# Patient Record
Sex: Male | Born: 1955
Health system: Southern US, Community
[De-identification: ages and names within clinical notes are randomized; demographics above are authoritative.]

## PROBLEM LIST (undated history)

## (undated) DIAGNOSIS — I10 Essential (primary) hypertension: Secondary | ICD-10-CM

## (undated) DIAGNOSIS — I700 Atherosclerosis of aorta: Secondary | ICD-10-CM

## (undated) DIAGNOSIS — Z973 Presence of spectacles and contact lenses: Secondary | ICD-10-CM

## (undated) DIAGNOSIS — E782 Mixed hyperlipidemia: Secondary | ICD-10-CM

## (undated) DIAGNOSIS — I2510 Atherosclerotic heart disease of native coronary artery without angina pectoris: Secondary | ICD-10-CM

## (undated) DIAGNOSIS — J449 Chronic obstructive pulmonary disease, unspecified: Secondary | ICD-10-CM

## (undated) DIAGNOSIS — H9193 Unspecified hearing loss, bilateral: Secondary | ICD-10-CM

## (undated) DIAGNOSIS — N400 Enlarged prostate without lower urinary tract symptoms: Secondary | ICD-10-CM

## (undated) DIAGNOSIS — F419 Anxiety disorder, unspecified: Secondary | ICD-10-CM

## (undated) DIAGNOSIS — J189 Pneumonia, unspecified organism: Secondary | ICD-10-CM

## (undated) DIAGNOSIS — M109 Gout, unspecified: Secondary | ICD-10-CM

## (undated) DIAGNOSIS — E785 Hyperlipidemia, unspecified: Secondary | ICD-10-CM

## (undated) DIAGNOSIS — I493 Ventricular premature depolarization: Secondary | ICD-10-CM

## (undated) HISTORY — DX: Ventricular premature depolarization: I49.3

## (undated) HISTORY — PX: ABDOMINAL SURGERY: SHX537

## (undated) HISTORY — PX: TONSILLECTOMY: SUR1361

## (undated) HISTORY — DX: Essential (primary) hypertension: I10

## (undated) HISTORY — DX: Hyperlipidemia, unspecified: E78.5

## (undated) HISTORY — PX: SHOULDER SURGERY: SHX246

## (undated) HISTORY — PX: EXPLORATORY LAPAROTOMY: SUR591

---

## 2001-04-17 ENCOUNTER — Emergency Department (HOSPITAL_COMMUNITY): Admission: EM | Admit: 2001-04-17 | Discharge: 2001-04-17 | Payer: Self-pay | Admitting: Internal Medicine

## 2001-04-24 ENCOUNTER — Emergency Department (HOSPITAL_COMMUNITY): Admission: EM | Admit: 2001-04-24 | Discharge: 2001-04-25 | Payer: Self-pay | Admitting: Emergency Medicine

## 2002-04-17 ENCOUNTER — Encounter: Payer: Self-pay | Admitting: Family Medicine

## 2002-04-17 ENCOUNTER — Observation Stay (HOSPITAL_COMMUNITY): Admission: EM | Admit: 2002-04-17 | Discharge: 2002-04-18 | Payer: Self-pay | Admitting: *Deleted

## 2002-04-18 ENCOUNTER — Encounter: Payer: Self-pay | Admitting: Internal Medicine

## 2002-04-20 ENCOUNTER — Encounter: Payer: Self-pay | Admitting: Family Medicine

## 2002-04-24 ENCOUNTER — Ambulatory Visit (HOSPITAL_COMMUNITY): Admission: RE | Admit: 2002-04-24 | Discharge: 2002-04-24 | Payer: Self-pay | Admitting: Family Medicine

## 2006-03-20 ENCOUNTER — Ambulatory Visit (HOSPITAL_COMMUNITY): Admission: RE | Admit: 2006-03-20 | Discharge: 2006-03-20 | Payer: Self-pay | Admitting: Urology

## 2008-02-25 ENCOUNTER — Encounter: Admission: RE | Admit: 2008-02-25 | Discharge: 2008-02-25 | Payer: Self-pay | Admitting: Otolaryngology

## 2009-05-10 ENCOUNTER — Ambulatory Visit (HOSPITAL_COMMUNITY): Admission: RE | Admit: 2009-05-10 | Discharge: 2009-05-11 | Payer: Self-pay | Admitting: Orthopedic Surgery

## 2009-05-10 HISTORY — PX: SHOULDER ARTHROSCOPY WITH SUBACROMIAL DECOMPRESSION AND BICEP TENDON REPAIR: SHX5689

## 2011-04-03 LAB — TYPE AND SCREEN: ABO/RH(D): O POS

## 2011-04-03 LAB — CBC
Hemoglobin: 15.6 g/dL (ref 13.0–17.0)
MCHC: 35 g/dL (ref 30.0–36.0)
RBC: 4.5 MIL/uL (ref 4.22–5.81)
WBC: 7.5 10*3/uL (ref 4.0–10.5)

## 2011-04-03 LAB — BASIC METABOLIC PANEL
CO2: 26 mEq/L (ref 19–32)
Calcium: 9.9 mg/dL (ref 8.4–10.5)
Creatinine, Ser: 0.97 mg/dL (ref 0.4–1.5)
GFR calc Af Amer: >60 mL/min (ref >60)
GFR calc non Af Amer: >60 mL/min (ref >60)
Sodium: 140 mEq/L (ref 135–145)

## 2011-04-03 LAB — DIFFERENTIAL
Basophils Relative: 1 % (ref 0–1)
Lymphocytes Relative: 26 % (ref 12–46)
Monocytes Relative: 7 % (ref 3–12)
Neutro Abs: 4.8 10*3/uL (ref 1.7–7.7)
Neutrophils Relative %: 63 % (ref 43–77)

## 2011-04-03 LAB — APTT: aPTT: 31 seconds (ref 24–37)

## 2011-04-03 LAB — PROTIME-INR: INR: 0.9 (ref 0.00–1.49)

## 2011-05-08 NOTE — Op Note (Signed)
NAME:  Anthony Summers, Anthony Summers NO.:  [id number]   MEDICAL RECORD NO.:  [id number]          PATIENT TYPE:  OIB   LOCATION:  5525                         FACILITY:  [facility]   PHYSICIAN:  Burnard Bunting, M.D.    DATE OF BIRTH:  12/03/1956   DATE OF PROCEDURE:  05/10/2009  DATE OF DISCHARGE:                               OPERATIVE REPORT   PREOPERATIVE DIAGNOSES:  1. Right shoulder bursitis.  2. Biceps tendon subluxation.  3. Rotator cuff tear.   POSTOPERATIVE DIAGNOSES:  1. Right shoulder bursitis.  2. Medial subluxation of the biceps tendon with intra-articular      synovitis.  3. Subacromial bursitis and complete tear without retraction of the      subscapularis tendon.   PROCEDURE:  Right shoulder diagnostic and operative arthroscopy with  biceps tendon release, extensive debridement of synovitis and labral  degeneration with release of the biceps tendon, subacromial  decompression, open biceps tenodesis, and open repair of the  subscapularis tendon tear.   SURGEON:  Burnard Bunting, MD   ASSISTANT:  April, RNFA   ANESTHESIA:  General.   INDICATIONS:  Anthony Summers is a 55 year old patient with right  shoulder pain, rotator cuff tearing, and  medial subluxation of the  biceps by MRI scanning.  He presents now for operative management after  explanation of risks and benefits.   OPERATIVE FINDINGS:  1. Examination under anesthesia, range of motion, forward flexion 180,      external rotation 50,  abduction is 65, shoulder stability is less      than a centimeter sulcus sign with less than 1+ anterior and      posterior instability.  2. Diagnostic operative arthroscopy.  3. Medial subluxation of the biceps.  4. Intact glenohumeral articular surface.  5. No injury to the anterior-inferior and posterior-inferior      glenohumeral ligaments.  6. Significant synovitis within the rotator interval with partial      tearing of the supraspinatus.  7. Complete tear of  the subscapularis, both the intra-articular      portion and the distal portion without retraction.   PROCEDURE IN DETAIL:  The patient brought to operating room where a  general endotracheal anesthesia was used.  Preoperative antibiotics were  administered.  Right shoulder, elbow, and hand was prescrubbed with  chlorhexidine, alcohol and allowed to air dry along with Betadine, then  prepped with DuraPrep solution and draped in a sterile manner.  Collier Flowers  was used to cover the axilla for the arthroscopy portion of the case.  Time-out was called.  Posterior portal was created 2 cm medial,  __________margin of the acromion.  Diagnostic arthroscopy was performed.  Significant synovitis was present within the glenohumeral joint.  Articular surfaces were intact.  Extensive debridement of synovitis was  performed along with release of biceps tendon and debridement of the  labrum as well as debridement of the partial-thickness rotator cuff  tear.  The intra-articular subscapularis was torn.  This tissue was  again debrided with the shaver through an anterior portal.  Lateral  portal was then created after scope was  placed in the subacromial space.  Significant bursitis was present.  Bursectomy was performed.  No full-  thickness component of supraspinatus tear was identified.  Subacromial  decompression with release of CA ligament was performed.  At this time,  the portals were closed.  The shoulder was reprepped and the entire  operative field was covered with Ioban.  Incision was made off the  anterolateral margin of the acromion.  Skin and subcutaneous tissue were  sharply divided.  Deltoid raphe was split between the anterior and  middle deltoid for measured distance of 4 cm.  A 0-Vicryl stay suture  was placed.  The subacromial decompression was adequate.  The transverse  humeral ligament was then divided on the medial side, biceps tendon was  actually subluxated medially across the entire  lesser tuberosity.  This  indicated that the subscapularis was completely detached from the lesser  tuberosity.  The biceps was then sutured with #2 FiberLoop and tenodesed  using 7 x 22 mm bioabsorbable interference screw into the humeral head.  The subscapularis tendon was then repaired back to the lesser tuberosity  after scraping of the lesser tuberosity.  The 15055 corkscrew suture  anchor was placed.  There was a small crack, although the suture anchor  was completely stable.  There was small distal crack in the tuberosity,  which necessitated placement of a single curved tech tunnel.  Through  this curved tech tunnel, two #2 FiberWire sutures were placed.  The  subscapularis was then reattached back to the lesser tuberosity using  the four strands of the anchor suture and the two strands through the  tendon.  All in all six strands were used to hold the subscapularis in  position.  The arm was taken through a range of motion.  The repair was  found to be stable.  The tissue lateral to the repair was then oversewn  to the tissue through the transverse humeral ligament and rotator cuff  tissue on the lateral aspect of the other bicipital groove and this gave  a reinforced repair which was done with 2-0 FiberWire sutures.  Incision  was thoroughly irrigated.  Instruments were removed.  Deltoid split was  closed using 0-Vicryl suture followed by interrupted inverted 2-0 Vicryl  suture, running 3-0 pullout Prolene, solution of Marcaine, morphine  finally injected in the subacromial space.  The patient tolerated well  without immediate complication.  Bulky dressing and shoulder mobilizer  was placed.   April's assistance was required at all times for retraction of important  neurovascular structures.  Assistance was a medical necessity.       Burnard Bunting, M.D.  Electronically Signed     GSD/MEDQ  D:  05/10/2009  T:  05/11/2009  Job:  [id number]

## 2011-05-11 NOTE — Consult Note (Signed)
[place]  Patient:    Anthony Summers, Anthony Summers                    MRN: [id number] Proc. Date: 04/25/01 Adm. Date:  [id number] Disc. Date: [id number] Attending:  Cathren Laine                          Consultation Report  REASON FOR CONSULTATION:  Epistaxis.  HISTORY OF PRESENT ILLNESS:  This is a 55 year old gentleman who was evaluated earlier today by Lucky Cowboy, M.D. in our office for recurring right-sided epistaxis. She was not able to identify a bleeding site. There was severe deviation of the septum and large turbinates on the right side limiting exposure on the right side. He had been bleeding intermittently throughout the day, but it got real severe around 11 oclock at night. The patient has a history of alcohol abuse and cigarette smoking. No other pertinent medical history.  PHYSICAL EXAMINATION:  GENERAL:  Healthy-appearing gentleman in no distress. He does not appear to be particularly pale. He is alert and oriented x 3.  VITAL SIGNS:  Stable.  HEENT:  Examination of the nasal cavities reveal a large spur down the right inferior nasal cavity and another spur posteriorly on the right side. The inferior turbinate is thickened and displaced medially.  PROCEDURE:  The nasal cavity was packed with cotton, soaked in 4% cocaine solution for topical anesthesia; 1% Xylocaine with epinephrine was then infiltrated into the septum, the inferior turbinate, the middle turbinate, and the superior turbinate. A greater palatine block was also accomplished.  Nasal endoscopy was performed with a flexible nasopharyngoscope. After a thorough evaluation, I was not able to identify any abnormal tissue or granulation tissue that might explain the source of the bleeding. We tried provocative maneuvers. I had him standing up, bending over repeatedly, and we were not able to start any further bleeding. Decision was made to pack the nose. Six feet Vaseline gauze  was then used to pack the superior and posterior nasal vault area. The inferior aspect of the nasal cavity was packed with a Rhino Rocket Merocel pack which was inflated with anesthetic solution. The patient was observed in the ER for about 30 minutes. There was no further bleeding. He was then discharged to home. Prescription was given for Keflex 500 mg q.i.d., and he is instructed to follow up in 5 days in the office for packing removal or sooner if he has any additional heavy bleeding. DD:  04/25/01 TD:  04/26/01 Job: [id number]

## 2011-05-11 NOTE — H&P (Signed)
[place]  Patient:    Anthony Summers, Anthony Summers Visit Number: [id number] MRN: [id number]          Service Type: MED Location: [phone] Attending Physician:  Jerl Santos Dictated by:   Jerl Santos, M.D. Admit Date:  04/17/2002 Discharge Date: 04/18/2002   CC:         Teena Irani. Arlyce Dice, M.D.  [facility]   History and Physical  HISTORY OF PRESENT ILLNESS:  Anthony Summers is a very pleasant 55 year old man who indicates that today he returned from [place], [place], after seeing his new grandchild.  On the trip back, he began to experience numbness in his right arm.  He initially attributed this to how he was holding his arm on the steering wheel, but the numbness did not seem to resolve with movement. Shortly thereafter, he began to experience numbness in his right leg.  This was quite noticeable and eventually he decided to get out of the car and walk around.  This did not seem to help very much.  At this point, he noticed that he did not have any headache.  There was no visual blurring and no ataxia. There was no weakness.  On arrival home, he began to experience nausea and this together with the numbness led him to decide to come to the emergency room.  Nothing like this had ever happened before.  He has no history of migraine headaches.  The past medical history is remarkable for past documentation of hyperlipidemia with a cholesterol of 260 and for a 50-pack-year smoking history.  He continues to smoke two packs of cigarettes per day.  There is no family history of heart disease or stroke.  There is no family history of hypertension or diabetes.  In the emergency room, a CT scan of the brain was performed which was negative.  The patient had a series of laboratory studies performed which were within normal limits.  He is admitted at this time for possible TIA versus CVA.  PAST MEDICAL HISTORY:  Strikingly normal.  No  medical illnesses.  PAST SURGICAL HISTORY:  None.  MEDICATIONS:  The patient takes no medicines.  ALLERGIES:  He has no known drug allergies.  HABITS:  He smokes two pack of cigarettes per day.  He denies abuse of alcohol or drugs.  FAMILY HISTORY:  Both his mother and father are in good health.  He has two half-brothers who are also in good health.  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  REVIEW OF SYSTEMS:  Head:  He denies headaches or dizziness.  Eyes:  He denies visual blurring or diplopia.  Ears, Nose, and Throat:  Denies earaches, sinus pain, or sore throat.  Chest:  Denies cough, wheezing, or chest congestion. Cardiovascular:  Denies orthopnea, PND, or ankle edema.  GI:  Denies nausea, vomiting, or abdominal pain.  GU:  Denies dysuria, urinary frequency, hesitancy, or nocturia.  Rheumatologic:  Denies back pain or joint pain. Hematologic:  Denies easy bleeding or bruising.  Neurologic:  Denies a history of seizure or stroke.  Otherwise see above.  PHYSICAL EXAMINATION:  The HEENT exam is within normal limits.  CHEST:  Clear.  CARDIOVASCULAR:  Normal S1 and S2 without murmurs, rubs, or gallops.  ABDOMEN:  Benign.  Normoactive bowel sounds without masses, tenderness, or organomegaly.  NEUROLOGIC:  Cranial nerves, motor, sensory, and cerebellar testing are normal.  Station and  gait are normal.  EXTREMITIES:  Normal.  IMPRESSION: 1. Possible transient ischemic attack versus cerebrovascular accident. 2. A 50-pack-year smoking history. 3. History of increased cholesterol.  PLAN:  The patient will be admitted for evaluation.  A neurologic consultation may be warranted.  I will schedule an MRI scan, carotid Doppler, 2-D echocardiogram, lipid profile, and homocystine level.  A neurology consultation may be warranted. Dictated by:   Jerl Santos, M.D. Attending Physician:  Jerl Santos DD:  04/17/02 TD:   04/19/02 Job: [id number]

## 2014-06-20 ENCOUNTER — Encounter (HOSPITAL_COMMUNITY): Payer: Self-pay | Admitting: Emergency Medicine

## 2014-06-20 ENCOUNTER — Emergency Department (HOSPITAL_COMMUNITY)
Admission: EM | Admit: 2014-06-20 | Discharge: 2014-06-20 | Disposition: A | Discharge status: Home / Self Care | Payer: 59 | Source: Home / Self Care | Attending: Emergency Medicine | Admitting: Emergency Medicine

## 2014-06-20 ENCOUNTER — Emergency Department (INDEPENDENT_AMBULATORY_CARE_PROVIDER_SITE_OTHER): Payer: 59

## 2014-06-20 DIAGNOSIS — Y92009 Unspecified place in unspecified non-institutional (private) residence as the place of occurrence of the external cause: Secondary | ICD-10-CM

## 2014-06-20 DIAGNOSIS — R0789 Other chest pain: Secondary | ICD-10-CM

## 2014-06-20 DIAGNOSIS — W19XXXA Unspecified fall, initial encounter: Secondary | ICD-10-CM

## 2014-06-20 MED ORDER — OXYCODONE-ACETAMINOPHEN 5-325 MG PO TABS: 1.0000 | ORAL_TABLET | ORAL | Status: DC | PRN

## 2014-06-20 MED ORDER — HYDROCODONE-ACETAMINOPHEN 5-325 MG PO TABS
1.0000 | ORAL_TABLET | Freq: Once | ORAL | Status: AC
  Administered 2014-06-20: 1 via ORAL

## 2014-06-20 MED ORDER — IBUPROFEN 800 MG PO TABS
ORAL_TABLET | ORAL | Status: AC
  Filled 2014-06-20: qty 1

## 2014-06-20 MED ORDER — IBUPROFEN 800 MG PO TABS
800.0000 mg | ORAL_TABLET | Freq: Once | ORAL | Status: AC
  Administered 2014-06-20: 800 mg via ORAL

## 2014-06-20 MED ORDER — HYDROCODONE-ACETAMINOPHEN 5-325 MG PO TABS
ORAL_TABLET | ORAL | Status: AC
  Filled 2014-06-20: qty 1

## 2014-06-20 NOTE — ED Notes (Signed)
Reports falling down front porch steps yesterday.  Pt having pain left rib cage.  Pain with coughing.  Denies sob.   Mild relief with aleve.

## 2014-06-20 NOTE — Discharge Instructions (Signed)
Please review instructions below. Use the incentive spirometry as instructed every 1 to 2 hours while awake and at home. Take Ibuprofen 600-800 mg three times a day for the next 4-6 days then only as needed afterwards. Use the Percocet as directed for more severe pain. Arrange follow up with Dr Alyson Ingles if not gradually improving over the next week.   Chest Wall Pain Chest wall pain is pain felt in or around the chest bones and muscles. It may take up to 6 weeks to get better. It may take longer if you are active. Chest wall pain can happen on its own. Other times, things like germs, injury, coughing, or exercise can cause the pain. HOME CARE   Avoid activities that make you tired or cause pain. Try not to use your chest, belly (abdominal), or side muscles. Do not use heavy weights.  Put ice on the sore area.  Put ice in a plastic bag.  Place a towel between your skin and the bag.  Leave the ice on for 15-20 minutes for the first 2 days.  Only take medicine as told by your doctor. GET HELP RIGHT AWAY IF:   You have more pain or are very uncomfortable.  You have a fever.  Your chest pain gets worse.  You have new problems.  You feel sick to your stomach (nauseous) or throw up (vomit).  You start to sweat or feel lightheaded.  You have a cough with mucus (phlegm).  You cough up blood. MAKE SURE YOU:   Understand these instructions.  Will watch your condition.  Will get help right away if you are not doing well or get worse. Document Released: 05/28/2008 Document Revised: 03/03/2012 Document Reviewed: 08/06/2011 [facility] Patient Information 2015 [place], [place]. This information is not intended to replace advice given to you by your health care provider. Make sure you discuss any questions you have with your health care provider.

## 2014-06-29 NOTE — ED Provider Notes (Signed)
CSN: [id number]     Arrival date & time 06/20/14  2426 History   First MD Initiated Contact with Patient 06/20/14 1044     Chief Complaint  Patient presents with  . Fall    rib injury    Patient is a 58 y.o. male presenting with fall. The history is provided by the patient.  Fall This is a new problem. The current episode started yesterday. The problem occurs constantly. The problem has been gradually worsening. Associated symptoms include chest pain. Pertinent negatives include no abdominal pain, no headaches and no shortness of breath. The symptoms are aggravated by coughing. The symptoms are relieved by NSAIDs. The treatment provided mild relief.  Pt reports he tripped and fell on some concrete steps yesterday landing on his (L) side. Since the fall he has had sharp (L) sided rib area pain that is worse w/ deep inspiration and coughing. Denies other injuries.   History reviewed. No pertinent past medical history. Past Surgical History  Procedure Laterality Date  . Shoulder surgery     History reviewed. No pertinent family history. History  Substance Use Topics  . Smoking status: Never Smoker   . Smokeless tobacco: Not on file  . Alcohol Use: Yes    Review of Systems  Respiratory: Negative for shortness of breath.   Cardiovascular: Positive for chest pain.  Gastrointestinal: Negative for abdominal pain.  Neurological: Negative for headaches.  All other systems reviewed and are negative.   Allergies  Review of patient's allergies indicates no known allergies.  Home Medications   Prior to Admission medications   Medication Sig Start Date End Date Taking? Authorizing Provider  cholecalciferol (VITAMIN D) 1000 UNITS tablet Take 1,000 Units by mouth daily.   Yes Historical Provider, MD  Misc Natural Products (NF FORMULAS TESTOSTERONE PO) Take by mouth.   Yes Historical Provider, MD  oxyCODONE-acetaminophen (PERCOCET/ROXICET) 5-325 MG per tablet Take 1 tablet by mouth every 4  (four) hours as needed for severe pain. 06/20/14   Rhetta Mura Schorr, NP   BP 165/84  Pulse 83  Temp(Src) 98.3 F (36.8 C) (Oral)  Resp 18  SpO2 98% Physical Exam  Nursing note and vitals reviewed. Constitutional: He is oriented to person, place, and time. He appears well-developed and well-nourished.  HENT:  Head: Normocephalic and atraumatic.  Eyes: Conjunctivae are normal.  Neck: Spinous process tenderness present.  Cardiovascular: Normal rate.   Pulmonary/Chest: Effort normal. He has wheezes.  No objective findings c/w trauma. Expiratory wheezes bil L>R.   Musculoskeletal:  No bony spinal TTP  Neurological: He is alert and oriented to person, place, and time.  Skin: Skin is warm and dry.  Psychiatric: He has a normal mood and affect.    ED Course  Procedures (including critical care time) Labs Review Labs Reviewed - No data to display  Imaging Review No results found.   MDM   1. Chest pain, musculoskeletal   2. Fall at home, initial encounter    (L) lateral chest wall pain s/p fall on concrete steps. CXR negative for rib fx's but will treat clinically for same. Pt to use incentive spirometry q1-2h while awake. Oxycodone/APAP for pain. Pt to arrange f/u w/ PCP.    [place], NP 06/29/14 [phone]

## 2014-06-29 NOTE — ED Provider Notes (Signed)
Medical screening examination/treatment/procedure(s) were performed by non-physician practitioner and as supervising physician I was immediately available for consultation/collaboration.  Philipp Deputy, M.D.  Harden Mo, MD 06/29/14 2024

## 2014-10-08 ENCOUNTER — Other Ambulatory Visit: Payer: Self-pay

## 2017-01-17 ENCOUNTER — Ambulatory Visit (INDEPENDENT_AMBULATORY_CARE_PROVIDER_SITE_OTHER): Payer: 59

## 2017-01-17 ENCOUNTER — Ambulatory Visit (INDEPENDENT_AMBULATORY_CARE_PROVIDER_SITE_OTHER): Payer: 59 | Admitting: Orthopedic Surgery

## 2017-01-17 ENCOUNTER — Encounter (INDEPENDENT_AMBULATORY_CARE_PROVIDER_SITE_OTHER): Payer: Self-pay | Admitting: Orthopedic Surgery

## 2017-01-17 DIAGNOSIS — M25511 Pain in right shoulder: Secondary | ICD-10-CM

## 2017-01-17 DIAGNOSIS — G8929 Other chronic pain: Secondary | ICD-10-CM

## 2017-01-18 ENCOUNTER — Encounter (INDEPENDENT_AMBULATORY_CARE_PROVIDER_SITE_OTHER): Payer: Self-pay | Admitting: Orthopedic Surgery

## 2017-01-18 NOTE — Progress Notes (Signed)
Office Visit Note   Patient: Anthony Summers           Date of Birth: 1956-05-28           MRN: [id number] Visit Date: 01/17/2017 Requested by: No referring provider defined for this encounter. PCP: No primary care provider on file.  Subjective: Chief Complaint  Patient presents with  . Right Shoulder - Pain    HPI [name] is a 61 year old patient with right shoulder pain.  Been going on 6-7 weeks.  He was unloading a bulky but not heavy item 7 weeks ago when he felt something hurt in his shoulder down to his elbow.  Describes decent range of motion but has pain at rest.  Does have a history of surgery 8 years ago with biceps tendon release and rotator cuff repair with the subscapularis.  He's been playing golf since this injury but it does hurt him some.  He tried Aleve.  He is able to sleep reasonably well.              Review of Systems All systems reviewed are negative as they relate to the chief complaint within the history of present illness.  Patient denies  fevers or chills.    Assessment & Plan: Visit Diagnoses:  1. Chronic right shoulder pain     Plan: Impression right shoulder pain possible biceps tendon tenodesis late failure.  I did look at the shoulder with ultrasound and the subscap appears intact.  Supraspinatus looks a little thin but I can't say that I saw definite tear.  Anthony Summers see how he does for 3 weeks taking anti-inflammatory on a daily basis.  He'll call me in 3 weeks and we could consider setting up an MRI scan at that time.  Follow-Up Instructions: No Follow-up on file.   Orders:  Orders Placed This Encounter  Procedures  . XR Shoulder Right   No orders of the defined types were placed in this encounter.     Procedures: No procedures performed   Clinical Data: No additional findings.  Objective: Vital Signs: There were no vitals taken for this visit.  Physical Exam   Constitutional: Patient appears well-developed HEENT:  Head:  Normocephalic Eyes:EOM are normal Neck: Normal range of motion Cardiovascular: Normal rate Pulmonary/chest: Effort normal Neurologic: Patient is alert Skin: Skin is warm Psychiatric: Patient has normal mood and affect    Ortho Exam examination of the right shoulder demonstrates full active and passive range of motion.  He doesn't have a definite Popeye deformity but there is asymmetry in a difference right versus left and the biceps tendon.  His supraspinatus subscap and infraspinatus strength is intact.  Impingement sign is negative.  No real before meals joint tenderness to direct palpation.  No other masses lymph and IV or skin changes noted in the shoulder region  Specialty Comments:  No specialty comments available.  Imaging: Xr Shoulder Right  Result Date: 01/18/2017 AP outlet and axillary right shoulder reviewed.  Acromiohumeral distance is maintained.  Visualized lung fields clear.  No glenohumeral arthritis is present.    PMFS History: There are no active problems to display for this patient.  No past medical history on file.  No family history on file.  Past Surgical History:  Procedure Laterality Date  . SHOULDER SURGERY     Social History   Occupational History  . Not on file.   Social History Main Topics  . Smoking status: Current Every Day Smoker  . Smokeless  tobacco: Never Used  . Alcohol use Yes  . Drug use: No  . Sexual activity: Yes

## 2017-01-25 ENCOUNTER — Emergency Department (HOSPITAL_COMMUNITY)
Admission: EM | Admit: 2017-01-25 | Discharge: 2017-01-25 | Discharge status: Against Medical Advice | Payer: 59 | Attending: Emergency Medicine | Admitting: Emergency Medicine

## 2017-01-25 ENCOUNTER — Encounter (HOSPITAL_COMMUNITY): Payer: Self-pay

## 2017-01-25 DIAGNOSIS — L509 Urticaria, unspecified: Secondary | ICD-10-CM | POA: Diagnosis not present

## 2017-01-25 DIAGNOSIS — T7840XA Allergy, unspecified, initial encounter: Secondary | ICD-10-CM

## 2017-01-25 DIAGNOSIS — F17200 Nicotine dependence, unspecified, uncomplicated: Secondary | ICD-10-CM | POA: Insufficient documentation

## 2017-01-25 DIAGNOSIS — Z79899 Other long term (current) drug therapy: Secondary | ICD-10-CM | POA: Diagnosis not present

## 2017-01-25 DIAGNOSIS — L500 Allergic urticaria: Secondary | ICD-10-CM | POA: Diagnosis not present

## 2017-01-25 MED ORDER — PREDNISONE 50 MG PO TABS
60.0000 mg | ORAL_TABLET | Freq: Once | ORAL | Status: AC
  Administered 2017-01-25: 60 mg via ORAL
  Filled 2017-01-25: qty 1

## 2017-01-25 MED ORDER — FAMOTIDINE 20 MG PO TABS
20.0000 mg | ORAL_TABLET | Freq: Once | ORAL | Status: AC
  Administered 2017-01-25: 20 mg via ORAL
  Filled 2017-01-25: qty 1

## 2017-01-25 NOTE — ED Provider Notes (Signed)
[facility] DEPT Provider Note   CSN: [id number] Arrival date & time: 01/25/17  0139  Time seen 01:55 AM   History   Chief Complaint Chief Complaint  Patient presents with  . Allergic Reaction    HPI Anthony Summers is a 61 y.o. male.  HPI   patient reports about 2 hours ago he was watching TV and he started itching and breaking out in hives. He denies any swelling of his lips or tongue, any difficulty breathing or swallowing. He denies being on any blood pressure medications. He denies any new exposures today although they did go into a music shop today that he suspects has a lot of chemicals although they couldn't smell them. He also states he's been taking one Aleve twice a day for some discomfort in his shoulder. Patient states he took Benadryl about 1245. He states his itching is improved and the rash is improving.  PCP Thressa Sheller, MD  History reviewed. No pertinent past medical history.  There are no active problems to display for this patient.   Past Surgical History:  Procedure Laterality Date  . SHOULDER SURGERY         Home Medications    Prior to Admission medications   Medication Sig Start Date End Date Taking? Authorizing Provider  cholecalciferol (VITAMIN D) 1000 UNITS tablet Take 1,000 Units by mouth daily.   Yes Historical Provider, MD  naproxen sodium (ANAPROX) 220 MG tablet Take 220 mg by mouth 2 (two) times daily with a meal.   Yes Historical Provider, MD  buPROPion (ZYBAN) 150 MG 12 hr tablet Take 150 mg by mouth 2 (two) times daily.    Historical Provider, MD  Misc Natural Products (NF FORMULAS TESTOSTERONE PO) Take by mouth.    Historical Provider, MD  oxyCODONE-acetaminophen (PERCOCET/ROXICET) 5-325 MG per tablet Take 1 tablet by mouth every 4 (four) hours as needed for severe pain. Patient not taking: Reported on 01/17/2017 06/20/14   Jeryl Columbia, NP    Family History No family history on file.  Social History Social History    Substance Use Topics  . Smoking status: Current Every Day Smoker  . Smokeless tobacco: Never Used  . Alcohol use Yes  retired Smokes 1 ppd   Allergies   Patient has no known allergies.   Review of Systems Review of Systems  All other systems reviewed and are negative.    Physical Exam Updated Vital Signs BP 127/85 (BP Location: Left Arm)   Pulse 103   Temp 97.5 F (36.4 C) (Oral)   Resp 16   Ht 6' (1.829 m)   Wt 215 lb (97.5 kg)   SpO2 94%   BMI 29.16 kg/m   Vital signs normal except for tachycardia   Physical Exam  Constitutional: He is oriented to person, place, and time. He appears well-developed and well-nourished.  Non-toxic appearance. He does not appear ill. No distress.  HENT:  Head: Normocephalic and atraumatic.  Right Ear: External ear normal.  Left Ear: External ear normal.  Nose: Nose normal. No mucosal edema or rhinorrhea.  Mouth/Throat: Oropharynx is clear and moist and mucous membranes are normal. No dental abscesses or uvula swelling.  Eyes: Conjunctivae and EOM are normal. Pupils are equal, round, and reactive to light.  Neck: Normal range of motion and full passive range of motion without pain. Neck supple.  Cardiovascular: Normal rate, regular rhythm and normal heart sounds.  Exam reveals no gallop and no friction rub.   No murmur heard.  Pulmonary/Chest: Effort normal and breath sounds normal. No respiratory distress. He has no wheezes. He has no rhonchi. He has no rales. He exhibits no tenderness and no crepitus.  Abdominal: Soft. Normal appearance and bowel sounds are normal. He exhibits no distension. There is no tenderness. There is no rebound and no guarding.  Musculoskeletal: Normal range of motion. He exhibits no edema or tenderness.  Moves all extremities well.   Neurological: He is alert and oriented to person, place, and time. He has normal strength. No cranial nerve deficit.  Skin: Skin is warm, dry and intact. Rash noted. No  erythema. No pallor.  Patient now just has a few scattered small urticaria on his lower abdomen.  Psychiatric: He has a normal mood and affect. His speech is normal and behavior is normal. His mood appears not anxious.  Nursing note and vitals reviewed.    ED Treatments / Results    Procedures Procedures (including critical care time)  Medications Ordered in ED Medications  predniSONE (DELTASONE) tablet 60 mg (60 mg Oral Given 01/25/17 0220)  famotidine (PEPCID) tablet 20 mg (20 mg Oral Given 01/25/17 0221)     Initial Impression / Assessment and Plan / ED Course  I have reviewed the triage vital signs and the nursing notes.  Pertinent labs & imaging results that were available during my care of the patient were reviewed by me and considered in my medical decision making (see chart for details).   Pt was given oral prednisone and oral pepcid (he had refused an IV when nurse saw him).  Nurse reports after he got his medications he wanted to leave and left. He did not wait to see if the medication worked or to get discharge prescriptions.   Final Clinical Impressions(s) / ED Diagnoses   Final diagnoses:  Allergic reaction, initial encounter  Urticaria    Pt eloped without getting discharge instructions or medications.   Rolland Porter, MD, Barbette Or, MD 01/25/17 [phone]

## 2017-01-25 NOTE — ED Triage Notes (Signed)
Pt states he was getting ready for bed when he noticed he was red and had whelts all over his torso, mild itching.  Pt denies knowing what is causing this reaction, states no new products or medications.   Pt denies throat or tongue swelling

## 2017-01-25 NOTE — ED Notes (Signed)
ED Provider at bedside. 

## 2017-01-25 NOTE — ED Notes (Signed)
Per pt's wife he was springing himself and he was leaving. Pt walked out with no distress noted.

## 2017-02-21 DIAGNOSIS — E559 Vitamin D deficiency, unspecified: Secondary | ICD-10-CM | POA: Diagnosis not present

## 2017-02-21 DIAGNOSIS — E291 Testicular hypofunction: Secondary | ICD-10-CM | POA: Diagnosis not present

## 2017-02-21 DIAGNOSIS — E785 Hyperlipidemia, unspecified: Secondary | ICD-10-CM | POA: Diagnosis not present

## 2017-05-29 DIAGNOSIS — H9312 Tinnitus, left ear: Secondary | ICD-10-CM | POA: Diagnosis not present

## 2017-05-29 DIAGNOSIS — H903 Sensorineural hearing loss, bilateral: Secondary | ICD-10-CM | POA: Diagnosis not present

## 2017-06-05 DIAGNOSIS — H9312 Tinnitus, left ear: Secondary | ICD-10-CM | POA: Diagnosis not present

## 2017-06-05 DIAGNOSIS — H903 Sensorineural hearing loss, bilateral: Secondary | ICD-10-CM | POA: Diagnosis not present

## 2017-06-12 DIAGNOSIS — I10 Essential (primary) hypertension: Secondary | ICD-10-CM | POA: Diagnosis not present

## 2017-09-05 ENCOUNTER — Ambulatory Visit (INDEPENDENT_AMBULATORY_CARE_PROVIDER_SITE_OTHER): Payer: 59 | Admitting: Orthopedic Surgery

## 2017-09-20 DIAGNOSIS — T464X5A Adverse effect of angiotensin-converting-enzyme inhibitors, initial encounter: Secondary | ICD-10-CM | POA: Diagnosis not present

## 2017-09-20 DIAGNOSIS — R05 Cough: Secondary | ICD-10-CM | POA: Diagnosis not present

## 2017-09-20 DIAGNOSIS — I10 Essential (primary) hypertension: Secondary | ICD-10-CM | POA: Diagnosis not present

## 2017-11-13 DIAGNOSIS — J4521 Mild intermittent asthma with (acute) exacerbation: Secondary | ICD-10-CM | POA: Diagnosis not present

## 2018-01-08 DIAGNOSIS — L821 Other seborrheic keratosis: Secondary | ICD-10-CM | POA: Diagnosis not present

## 2018-01-08 DIAGNOSIS — D225 Melanocytic nevi of trunk: Secondary | ICD-10-CM | POA: Diagnosis not present

## 2018-01-08 DIAGNOSIS — L820 Inflamed seborrheic keratosis: Secondary | ICD-10-CM | POA: Diagnosis not present

## 2018-02-11 DIAGNOSIS — H40013 Open angle with borderline findings, low risk, bilateral: Secondary | ICD-10-CM | POA: Diagnosis not present

## 2018-02-11 DIAGNOSIS — H2513 Age-related nuclear cataract, bilateral: Secondary | ICD-10-CM | POA: Diagnosis not present

## 2018-02-11 DIAGNOSIS — H25013 Cortical age-related cataract, bilateral: Secondary | ICD-10-CM | POA: Diagnosis not present

## 2018-03-27 DIAGNOSIS — E291 Testicular hypofunction: Secondary | ICD-10-CM | POA: Diagnosis not present

## 2018-03-27 DIAGNOSIS — I10 Essential (primary) hypertension: Secondary | ICD-10-CM | POA: Diagnosis not present

## 2018-03-27 DIAGNOSIS — E785 Hyperlipidemia, unspecified: Secondary | ICD-10-CM | POA: Diagnosis not present

## 2018-04-02 DIAGNOSIS — Z0000 Encounter for general adult medical examination without abnormal findings: Secondary | ICD-10-CM | POA: Diagnosis not present

## 2018-09-11 DIAGNOSIS — Z23 Encounter for immunization: Secondary | ICD-10-CM | POA: Diagnosis not present

## 2018-09-23 DIAGNOSIS — T7840XA Allergy, unspecified, initial encounter: Secondary | ICD-10-CM | POA: Diagnosis not present

## 2018-09-23 DIAGNOSIS — J449 Chronic obstructive pulmonary disease, unspecified: Secondary | ICD-10-CM | POA: Diagnosis not present

## 2018-09-23 DIAGNOSIS — I10 Essential (primary) hypertension: Secondary | ICD-10-CM | POA: Diagnosis not present

## 2018-09-23 DIAGNOSIS — L509 Urticaria, unspecified: Secondary | ICD-10-CM | POA: Diagnosis not present

## 2018-09-24 DIAGNOSIS — J449 Chronic obstructive pulmonary disease, unspecified: Secondary | ICD-10-CM | POA: Diagnosis not present

## 2018-09-24 DIAGNOSIS — T7840XA Allergy, unspecified, initial encounter: Secondary | ICD-10-CM | POA: Diagnosis not present

## 2018-09-24 DIAGNOSIS — I10 Essential (primary) hypertension: Secondary | ICD-10-CM | POA: Diagnosis not present

## 2018-10-13 ENCOUNTER — Emergency Department (HOSPITAL_COMMUNITY)
Admission: EM | Admit: 2018-10-13 | Discharge: 2018-10-13 | Disposition: A | Discharge status: Home / Self Care | Payer: 59 | Attending: Emergency Medicine | Admitting: Emergency Medicine

## 2018-10-13 ENCOUNTER — Encounter (HOSPITAL_COMMUNITY): Payer: Self-pay | Admitting: Emergency Medicine

## 2018-10-13 DIAGNOSIS — F17210 Nicotine dependence, cigarettes, uncomplicated: Secondary | ICD-10-CM | POA: Diagnosis not present

## 2018-10-13 DIAGNOSIS — Z79899 Other long term (current) drug therapy: Secondary | ICD-10-CM | POA: Insufficient documentation

## 2018-10-13 DIAGNOSIS — L509 Urticaria, unspecified: Secondary | ICD-10-CM | POA: Insufficient documentation

## 2018-10-13 MED ORDER — FAMOTIDINE 20 MG PO TABS
40.0000 mg | ORAL_TABLET | Freq: Once | ORAL | Status: AC
Start: 2018-10-13 — End: 2018-10-13
  Administered 2018-10-13: 40 mg via ORAL
  Filled 2018-10-13: qty 2

## 2018-10-13 MED ORDER — PREDNISONE 10 MG PO TABS: 40.0000 mg | ORAL_TABLET | Freq: Every day | ORAL | 0 refills | Status: AC

## 2018-10-13 MED ORDER — PREDNISONE 50 MG PO TABS
60.0000 mg | ORAL_TABLET | Freq: Once | ORAL | Status: AC
  Administered 2018-10-13: 60 mg via ORAL
  Filled 2018-10-13: qty 1

## 2018-10-13 NOTE — Discharge Instructions (Signed)
Take 25mg  of benadryl every 6 hours as needed for itching. This medication can cause drowsiness. Do not drive or operate heavy machinery when taking this medication.   Prednisone - This is an oral steroid.  This medication is best taken with food in the morning.  Please note that this medication can cause anxiety, mood swings, muscle fatigue, increased hunger, weight gain (sodium/fluid retention), poor sleep as well as other symptoms. If you are a diabetic, please monitor your blood sugars at home as this medication can increase your blood sugars.   Call your pcp tomorrow to schedule an appointment for follow up.   Return for You have a fever. You have pain in your abdomen. You vomit Your tongue or lips are swollen. Your eyelids are swollen. Your chest or throat feels tight. You have trouble breathing or swallowing.

## 2018-10-13 NOTE — ED Triage Notes (Signed)
Pt states he has large itching whelps on his buttocks starting about an hour ago.  Taken 2 benadryl at 530 with no relief.  States this happened 3 weeks ago but was on upper body.

## 2018-10-13 NOTE — ED Provider Notes (Signed)
[facility] EMERGENCY DEPARTMENT Provider Note   CSN: [id number] Arrival date & time: 10/13/18  1847     History   Chief Complaint Chief Complaint  Patient presents with  . Allergic Reaction    HPI Anthony Summers is a 62 y.o. male with no significant past medical history presents emergency department today for hives.  Patient reports that 3 hours ago he started noticing hives on his buttocks.  His wife tried marking these areas but they have moved since onset.  He notes they are pruritic, mildly raised and red in characteristics.  He is tried Benadryl cream as well as 50 mg oral Benadryl for his symptoms to help with the itching.  He has a similar episode a few months ago in which his symptoms were relieved with prednisone and Benadryl.  He has never seen an allergist.  Patient denies any lip swelling, tongue swelling, difficulty swallowing, inability to control secretions, tightness of the throat, difficulty breathing, shortness of breath, chest tightness, abdominal cramping, nausea/vomiting/diarrhea.  Rash does not involve other areas of the skin or palms/soles. Denies fever, chills, contacts with persons with similar rash, or any changes in lotions/soaps/detergents, exposure to animal or plant irritants, and denies swelling or purulent discharge. No new medications. No recent travel. No recent tick bites. No involvement between webspaces.   HPI  History reviewed. No pertinent past medical history.  There are no active problems to display for this patient.   Past Surgical History:  Procedure Laterality Date  . ABDOMINAL SURGERY    . SHOULDER SURGERY          Home Medications    Prior to Admission medications   Medication Sig Start Date End Date Taking? Authorizing Provider  buPROPion (ZYBAN) 150 MG 12 hr tablet Take 150 mg by mouth 2 (two) times daily.    [provider]  cholecalciferol (VITAMIN D) 1000 UNITS tablet Take 1,000 Units by mouth daily.    [provider]  Misc Natural Products (NF FORMULAS TESTOSTERONE PO) Take by mouth.    [provider]  naproxen sodium (ANAPROX) 220 MG tablet Take 220 mg by mouth 2 (two) times daily with a meal.    [provider]  oxyCODONE-acetaminophen (PERCOCET/ROXICET) 5-325 MG per tablet Take 1 tablet by mouth every 4 (four) hours as needed for severe pain. Patient not taking: Reported on 01/17/2017 06/20/14   Schorr, Rhetta Mura, NP    Family History History reviewed. No pertinent family history.  Social History Social History   Tobacco Use  . Smoking status: Current Every Day Smoker    Packs/day: 1.00    Types: Cigarettes  . Smokeless tobacco: Never Used  Substance Use Topics  . Alcohol use: Yes    Comment: daily  . Drug use: No     Allergies   Patient has no known allergies.   Review of Systems Review of Systems  All other systems reviewed and are negative.    Physical Exam Updated Vital Signs BP (!) 159/89 (BP Location: Right Arm)   Pulse 86   Temp 98.4 F (36.9 C) (Oral)   Resp 18   Ht 6\' 1"  (1.854 m)   Wt 94.3 kg   SpO2 97%   BMI 27.44 kg/m   Physical Exam  Constitutional: He appears well-developed and well-nourished.  HENT:  Head: Normocephalic and atraumatic.  Right Ear: External ear normal.  Left Ear: External ear normal.  Nose: Nose normal.  Mouth/Throat: Uvula is midline, oropharynx is  clear and moist and mucous membranes are normal. No tonsillar exudate.  No oral lesions noted. The patient has normal phonation and is in control of secretions. No stridor.  Midline uvula without edema. Soft palate rises symmetrically. No tongue swelling. Tongue protrusion is normal. No trismus. No lip swelling. No angioedema. No facial swelling. Mucus membranes moist.   Eyes: Pupils are equal, round, and reactive to light. Right eye exhibits no discharge. Left eye exhibits no discharge. No scleral icterus.  No periorbital swelling.   Neck: Trachea normal.  Neck supple. No spinous process tenderness present. No neck rigidity. Normal range of motion present.  Cardiovascular: Normal rate, regular rhythm and intact distal pulses.  No murmur heard. Pulses:      Radial pulses are 2+ on the right side, and 2+ on the left side.       Dorsalis pedis pulses are 2+ on the right side, and 2+ on the left side.       Posterior tibial pulses are 2+ on the right side, and 2+ on the left side.  No lower extremity swelling or edema. Calves symmetric in size bilaterally.  Pulmonary/Chest: Effort normal and breath sounds normal. He exhibits no tenderness.  No increased work of breathing. No accessory muscle use. Patient is sitting upright, speaking in full sentences without difficulty   Abdominal: Soft. Bowel sounds are normal. There is no tenderness. There is no rigidity, no rebound and no guarding.  Musculoskeletal: He exhibits no edema.  Lymphadenopathy:    He has no cervical adenopathy.  Neurological: He is alert.  Skin: Skin is warm and dry. No rash noted. He is not diaphoretic.  Hives note to patients buttocks and lower back. No excoriations or superimposed infection. No TTP. No induration, fluctuance or evidence of abscess. No blisters, no pustules, no warmth, no draining sinus tracts, no superficial abscesses, no bullous impetigo, no vesicles, no desquamation, no target lesions with dusky purpura or a central bulla. Not tender to touch.  Psychiatric: He has a normal mood and affect.  Nursing note and vitals reviewed.    ED Treatments / Results  Labs (all labs ordered are listed, but only abnormal results are displayed) Labs Reviewed - No data to display  EKG None  Radiology No results found.  Procedures Procedures (including critical care time)  Medications Ordered in ED Medications  predniSONE (DELTASONE) tablet 60 mg (60 mg Oral Given 10/13/18 2017)  famotidine (PEPCID) tablet 40 mg (40 mg Oral Given 10/13/18 2017)     Initial  Impression / Assessment and Plan / ED Course  I have reviewed the triage vital signs and the nursing notes.  Pertinent labs & imaging results that were available during my care of the patient were reviewed by me and considered in my medical decision making (see chart for details).     62 y.o. male with urticaria.  Patient denies any difficulty breathing or swallowing.  Pt has a patent airway without stridor and is handling secretions without difficulty; no angioedema. No concern for anaphylaxis. No blisters, no pustules, no warmth, no draining sinus tracts, no superficial abscesses, no bullous impetigo, no vesicles, no desquamation, no target lesions with dusky purpura or a central bulla. Not tender to touch. No concern for superimposed infection. No concern for SJS, TEN, TSS, tick borne illness, syphilis or other life-threatening condition. He has had benadryl PTA. He was given prednisone and famotidine in the department. Will discharge home with short course of steroids. Recommend Benadryl as needed for  pruritis. Discussed to follow up with pcp. Recommended as this is the second occurrence, he should discuss seeing any allergist. Specific return precautions discussed. Time was given for all questions to be answered. The patient verbalized understanding and agreement with plan. The patient appears safe for discharge home.  Final Clinical Impressions(s) / ED Diagnoses   Final diagnoses:  Urticaria    ED Discharge Orders         Ordered    predniSONE (DELTASONE) 10 MG tablet  Daily     10/13/18 1953           Lorelle Gibbs 10/13/18 2021    Merrily Pew, MD 10/13/18 2119

## 2018-10-29 DIAGNOSIS — L501 Idiopathic urticaria: Secondary | ICD-10-CM | POA: Diagnosis not present

## 2018-10-29 DIAGNOSIS — J309 Allergic rhinitis, unspecified: Secondary | ICD-10-CM | POA: Diagnosis not present

## 2018-10-29 DIAGNOSIS — J3089 Other allergic rhinitis: Secondary | ICD-10-CM | POA: Diagnosis not present

## 2019-04-14 DIAGNOSIS — L570 Actinic keratosis: Secondary | ICD-10-CM | POA: Diagnosis not present

## 2019-04-14 DIAGNOSIS — Z85828 Personal history of other malignant neoplasm of skin: Secondary | ICD-10-CM | POA: Diagnosis not present

## 2019-04-14 DIAGNOSIS — L578 Other skin changes due to chronic exposure to nonionizing radiation: Secondary | ICD-10-CM | POA: Diagnosis not present

## 2019-04-27 DIAGNOSIS — Z7189 Other specified counseling: Secondary | ICD-10-CM | POA: Diagnosis not present

## 2019-04-27 DIAGNOSIS — I10 Essential (primary) hypertension: Secondary | ICD-10-CM | POA: Diagnosis not present

## 2019-05-12 DIAGNOSIS — M7989 Other specified soft tissue disorders: Secondary | ICD-10-CM | POA: Diagnosis not present

## 2019-05-12 DIAGNOSIS — I10 Essential (primary) hypertension: Secondary | ICD-10-CM | POA: Diagnosis not present

## 2019-08-20 DIAGNOSIS — I714 Abdominal aortic aneurysm, without rupture, unspecified: Secondary | ICD-10-CM | POA: Insufficient documentation

## 2019-08-20 DIAGNOSIS — K760 Fatty (change of) liver, not elsewhere classified: Secondary | ICD-10-CM | POA: Insufficient documentation

## 2019-09-03 ENCOUNTER — Ambulatory Visit: Payer: 59 | Admitting: Vascular Surgery

## 2019-09-03 ENCOUNTER — Other Ambulatory Visit: Payer: Self-pay

## 2019-09-03 ENCOUNTER — Encounter: Payer: Self-pay | Admitting: Vascular Surgery

## 2019-09-03 VITALS — BP 123/77 | HR 83 | Temp 97.9°F | Resp 20 | Ht 73.0 in | Wt 213.7 lb

## 2019-09-03 DIAGNOSIS — I714 Abdominal aortic aneurysm, without rupture, unspecified: Secondary | ICD-10-CM

## 2019-09-03 MED ORDER — ASPIRIN EC 81 MG PO TBEC: 81.0000 mg | DELAYED_RELEASE_TABLET | Freq: Every day | ORAL | Status: DC
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
## 2020-04-08 ENCOUNTER — Ambulatory Visit: Payer: 59 | Attending: Internal Medicine

## 2020-04-08 DIAGNOSIS — Z23 Encounter for immunization: Secondary | ICD-10-CM

## 2020-04-08 NOTE — Progress Notes (Signed)
   Covid-19 Vaccination Clinic  Name:  COTE SANDOR    MRN: [id number] DOB: 1956/03/04  04/08/2020  Mr. Midyette was observed post Covid-19 immunization for 15 minutes without incident. He was provided with Vaccine Information Sheet and instruction to access the V-Safe system.   Mr. Vanscoyk was instructed to call 911 with any severe reactions post vaccine: Marland Kitchen Difficulty breathing  . Swelling of face and throat  . A fast heartbeat  . A bad rash all over body  . Dizziness and weakness   Immunizations Administered    Name Date Dose VIS Date Route   Moderna COVID-19 Vaccine 04/08/2020 12:40 PM 0.5 mL 11/24/2019 Intramuscular   Manufacturer: Moderna   Lot: [id number]   [id number]

## 2020-04-13 ENCOUNTER — Ambulatory Visit: Payer: 59

## 2020-10-05 ENCOUNTER — Other Ambulatory Visit: Payer: Self-pay | Admitting: *Deleted

## 2020-11-02 ENCOUNTER — Other Ambulatory Visit: Payer: Self-pay

## 2020-11-02 DIAGNOSIS — I714 Abdominal aortic aneurysm, without rupture, unspecified: Secondary | ICD-10-CM

## 2020-11-02 DIAGNOSIS — I739 Peripheral vascular disease, unspecified: Secondary | ICD-10-CM

## 2020-11-02 NOTE — Addendum Note (Signed)
Addended byDoylene Bode on: 11/02/2020 03:55 PM   Modules accepted: Orders

## 2020-11-10 ENCOUNTER — Telehealth: Payer: Self-pay

## 2020-11-10 NOTE — Telephone Encounter (Signed)
Pt's wife Janett Billow called to get AAA duplex instructions. She verbalized understanding with no further questions/concerns at this time.

## 2020-11-11 ENCOUNTER — Encounter (HOSPITAL_COMMUNITY): Payer: Medicare Other

## 2020-11-11 ENCOUNTER — Ambulatory Visit (INDEPENDENT_AMBULATORY_CARE_PROVIDER_SITE_OTHER): Payer: Medicare Other | Admitting: Physician Assistant

## 2020-11-11 ENCOUNTER — Ambulatory Visit: Payer: Medicare Other

## 2020-11-11 ENCOUNTER — Other Ambulatory Visit: Payer: Self-pay

## 2020-11-11 ENCOUNTER — Ambulatory Visit (HOSPITAL_COMMUNITY)
Admission: RE | Admit: 2020-11-11 | Discharge: 2020-11-11 | Disposition: A | Discharge status: Home / Self Care | Payer: Medicare Other | Source: Ambulatory Visit | Attending: Vascular Surgery | Admitting: Vascular Surgery

## 2020-11-11 VITALS — BP 137/81 | HR 81 | Temp 98.3°F | Resp 20 | Ht 73.0 in | Wt 209.0 lb

## 2020-11-11 DIAGNOSIS — I714 Abdominal aortic aneurysm, without rupture, unspecified: Secondary | ICD-10-CM

## 2020-11-11 NOTE — Progress Notes (Signed)
Office Note     CC:  follow up Requesting Provider:  Janie Morning, DO  HPI: Anthony Summers is a 64 y.o. (February 10, 1956) male who presents for routine follow up for AAA. At time of last follow up in September of 2020 his aneurysm was 4.2 cm. He was not having any associated symptoms. He was taking a Zetia and was started on Aspirin 81 mg. He was counseled on smoking cessation and importance of good blood pressure control.   He returns today for his 1 year follow up and to review AAA duplex. He denies any abdominal or back pain.  He denies any claudication, rest pain or non healing wounds. 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   The pt is on Zetia for cholesterol management.  The pt is not a daily aspirin.   Other AC: none The pt is on  ARB and diuretic for hypertension.   The pt is not diabetic.  Tobacco hx: current  Past Medical History:  Diagnosis Date  . Hyperlipidemia   . Hypertension     Past Surgical History:  Procedure Laterality Date  . ABDOMINAL SURGERY    . SHOULDER SURGERY      Social History   Socioeconomic History  . Marital status: Married    Spouse name: Not on file  . Number of children: Not on file  . Years of education: Not on file  . Highest education level: Not on file  Occupational History  . Not on file  Tobacco Use  . Smoking status: Current Every Day Smoker    Packs/day: 1.00    Types: Cigarettes  . Smokeless tobacco: Never Used  Vaping Use  . Vaping Use: Never used  Substance and Sexual Activity  . Alcohol use: Yes    Comment: daily  . Drug use: No  . Sexual activity: Yes  Other Topics Concern  . Not on file  Social History Narrative  . Not on file   Social Determinants of Health   Financial Resource Strain:   . Difficulty of Paying Living Expenses: Not on file  Food Insecurity:   . Worried About Charity fundraiser in the Last Year: Not on file  . Ran Out of Food in  the Last Year: Not on file  Transportation Needs:   . Lack of Transportation (Medical): Not on file  . Lack of Transportation (Non-Medical): Not on file  Physical Activity:   . Days of Exercise per Week: Not on file  . Minutes of Exercise per Session: Not on file  Stress:   . Feeling of Stress : Not on file  Social Connections:   . Frequency of Communication with Friends and Family: Not on file  . Frequency of Social Gatherings with Friends and Family: Not on file  . Attends Religious Services: Not on file  . Active Member of Clubs or Organizations: Not on file  . Attends Archivist Meetings: Not on file  . Marital Status: Not on file  Intimate Partner Violence:   . Fear of Current or Ex-Partner: Not on file  . Emotionally Abused: Not on file  . Physically Abused: Not on file  . Sexually Abused: Not on file   No family history on file.  Current Outpatient Medications  Medication Sig Dispense Refill  . ALPRAZolam (XANAX) 0.5 MG tablet Take 0.5 mg by mouth daily as needed.    Marland Kitchen buPROPion [facility]  XL) 150 MG 24 hr tablet     . cholecalciferol (VITAMIN D) 1000 UNITS tablet Take 1,000 Units by mouth daily.    Marland Kitchen ezetimibe (ZETIA) 10 MG tablet TAKE 1 TABLET BY MOUTH EVERY DAY FOR CHOLESTEROL    . fluticasone (FLONASE) 50 MCG/ACT nasal spray INSTILL 1 TO 2 SPRAYS IN EACH NOSTRIL ONCE A DAY    . furosemide (LASIX) 20 MG tablet TAKE 1 TABLET BY MOUTH EVERY DAY AS NEEDED FOR SWELLING. DO NOT EXCEED 1 PER DAY.    Marland Kitchen losartan (COZAAR) 25 MG tablet     . naproxen sodium (ALEVE) 220 MG tablet Take 220 mg by mouth.    . vitamin B-12 (CYANOCOBALAMIN) 100 MCG tablet Take 100 mcg by mouth daily.     Current Facility-Administered Medications  Medication Dose Route Frequency Provider Last Rate Last Admin  . aspirin EC tablet 81 mg  81 mg Oral Daily Fields, Jessy Oto, MD        No Known Allergies   REVIEW OF SYSTEMS:  [X]  denotes positive finding, [ ]  denotes negative  finding Cardiac  Comments:  Chest pain or chest pressure:    Shortness of breath upon exertion:    Short of breath when lying flat:    Irregular heart rhythm:        Vascular    Pain in calf, thigh, or hip brought on by ambulation:    Pain in feet at night that wakes you up from your sleep:     Blood clot in your veins:    Leg swelling:         Pulmonary    Oxygen at home:    Productive cough:     Wheezing:         Neurologic    Sudden weakness in arms or legs:     Sudden numbness in arms or legs:     Sudden onset of difficulty speaking or slurred speech:    Temporary loss of vision in one eye:     Problems with dizziness:         Gastrointestinal    Blood in stool:     Vomited blood:         Genitourinary    Burning when urinating:     Blood in urine:        Psychiatric    Major depression:         Hematologic    Bleeding problems:    Problems with blood clotting too easily:        Skin    Rashes or ulcers:        Constitutional    Fever or chills:      PHYSICAL EXAMINATION:  Vitals:   11/11/20 0836  BP: 137/81  Pulse: 81  Resp: 20  Temp: 98.3 F (36.8 C)  TempSrc: Temporal  SpO2: 97%  Weight: 209 lb (94.8 kg)  Height: 6\' 1"  (1.854 m)    General:  WDWN in NAD; vital signs documented above Gait: Normal HENT: WNL, normocephalic Pulmonary: normal non-labored breathing , without  wheezing Cardiac: regular HR, without  Murmurs without carotid bruit Abdomen: soft, NT, no masses. Normal bowel sounds. No palpable AAA. No audible bruit. Healed laparotomy scar. Incisional hernia that is non tender Vascular Exam/Pulses:  Right Left  Radial 2+ (normal) 2+ (normal)  Femoral 2+ (normal) 2+ (normal)  Popliteal 2+ (normal) 2+ (normal)  DP 2+ (normal) 2+ (normal)  PT 2+ (normal) 2+ (normal)   Extremities:  without ischemic changes, without Gangrene , without cellulitis; without open wounds;  Musculoskeletal: no muscle wasting or atrophy  Neurologic: A&O X  3;  No focal weakness or paresthesias are detected Psychiatric:  The pt has Normal affect.   Non-Invasive Vascular Imaging:   AAA duplex 11/11/20 Current Diameter: 4.40 x 4.90 cm Previous Diameter: 4.2 cm  Right CIA: 1.1 x 1.5 cm Left CIA: 1.1 x 1.2 cm  ASSESSMENT/PLAN:: 64 y.o. male here for follow up for AAA. He presently remains asymptomatic. His duplex today shows slight increase from prior duplex with greatest diameter of 4.9 cm. He was advised about starting Aspirin 81 mg at last visit but he never did start this. I have encouraged him to take an Aspirin 81 mg daily. He will also continue his other current medications -I re-discussed possibility of repairing the aneurysm if it reaches of 5-1/2 cm in diameter - discussed with him signs symptoms of growth of aneurysm or rupture of aneurysm.  He knows to go to the emergency room if he experiences any severe back or abdominal pain -He will follow up in 6 months with repeat AAA duplex   Karoline Caldwell, PA-C [facility] [phone]  Clinic MD:  Dr. Donzetta Matters

## 2020-12-15 ENCOUNTER — Other Ambulatory Visit: Payer: Self-pay | Admitting: Family Medicine

## 2020-12-15 DIAGNOSIS — F17200 Nicotine dependence, unspecified, uncomplicated: Secondary | ICD-10-CM

## 2021-01-05 ENCOUNTER — Inpatient Hospital Stay: Admission: RE | Admit: 2021-01-05 | Payer: Medicare Other | Source: Ambulatory Visit

## 2021-01-22 ENCOUNTER — Emergency Department (HOSPITAL_COMMUNITY)
Admission: EM | Admit: 2021-01-22 | Discharge: 2021-01-22 | Disposition: A | Discharge status: Home / Self Care | Payer: Medicare Other | Attending: Emergency Medicine | Admitting: Emergency Medicine

## 2021-01-22 ENCOUNTER — Other Ambulatory Visit: Payer: Self-pay

## 2021-01-22 ENCOUNTER — Encounter (HOSPITAL_COMMUNITY): Payer: Self-pay | Admitting: Emergency Medicine

## 2021-01-22 DIAGNOSIS — L500 Allergic urticaria: Secondary | ICD-10-CM | POA: Diagnosis not present

## 2021-01-22 DIAGNOSIS — I10 Essential (primary) hypertension: Secondary | ICD-10-CM | POA: Diagnosis not present

## 2021-01-22 DIAGNOSIS — T7840XA Allergy, unspecified, initial encounter: Secondary | ICD-10-CM

## 2021-01-22 DIAGNOSIS — Z79899 Other long term (current) drug therapy: Secondary | ICD-10-CM | POA: Diagnosis not present

## 2021-01-22 DIAGNOSIS — L509 Urticaria, unspecified: Secondary | ICD-10-CM

## 2021-01-22 DIAGNOSIS — F17210 Nicotine dependence, cigarettes, uncomplicated: Secondary | ICD-10-CM | POA: Diagnosis not present

## 2021-01-22 DIAGNOSIS — L299 Pruritus, unspecified: Secondary | ICD-10-CM | POA: Diagnosis present

## 2021-01-22 LAB — CBG MONITORING, ED: Glucose-Capillary: 116 mg/dL — ABNORMAL HIGH (ref 70–99)

## 2021-01-22 MED ORDER — PREDNISONE 50 MG PO TABS
60.0000 mg | ORAL_TABLET | Freq: Once | ORAL | Status: AC
  Administered 2021-01-22: 60 mg via ORAL
  Filled 2021-01-22: qty 1

## 2021-01-22 MED ORDER — FAMOTIDINE 20 MG PO TABS
20.0000 mg | ORAL_TABLET | Freq: Once | ORAL | Status: AC
Start: 2021-01-22 — End: 2021-01-22
  Administered 2021-01-22: 20 mg via ORAL
  Filled 2021-01-22: qty 1

## 2021-01-22 MED ORDER — FAMOTIDINE 20 MG PO TABS: 20.0000 mg | ORAL_TABLET | Freq: Two times a day (BID) | ORAL | 0 refills | Status: DC

## 2021-01-22 MED ORDER — PREDNISONE 20 MG PO TABS: ORAL_TABLET | ORAL | 0 refills | Status: DC

## 2021-01-22 NOTE — Discharge Instructions (Addendum)
Stay cool, heat will make the rash or itching worse.  You can continue Benadryl or take Claritin or Zyrtec over-the-counter 1 tablet a day.  Finish the prednisone and Pepcid.  Recheck if you have difficulty swallowing, breathing, or get swelling of her tongue or lips.

## 2021-01-22 NOTE — ED Triage Notes (Signed)
Pt c/o itching and hives that started at 0230 this morning. Pt states that this happened several times before, but the cause is unknown. Pt took 2 benadryl's PTA

## 2021-01-22 NOTE — ED Provider Notes (Signed)
[facility] EMERGENCY DEPARTMENT Provider Note   CSN: [id number] Arrival date & time: 01/22/21  6629   Time seen 3:47 AM  History Chief Complaint  Patient presents with  . Allergic Reaction    Anthony Summers is a 65 y.o. male.  HPI   Patient reports he has had hives in the past from unknown etiology.  He denies anything new.  He has been on losartan for about a year.  He was seen in the ED on February 2018 and October 2019 with urticaria.  He states about 230 this morning he had fallen asleep in the recliner and when he woke up to go to bed he realized he was itching all over.  He denies any difficulty swallowing or breathing.  He denies any swelling of his lips or tongue.  He states he took 2 Benadryl about 2:45 AM.  He denies anything new in the past several days.  PCP Janie Morning, DO   Past Medical History:  Diagnosis Date  . Hyperlipidemia   . Hypertension     There are no problems to display for this patient.   Past Surgical History:  Procedure Laterality Date  . ABDOMINAL SURGERY    . SHOULDER SURGERY         No family history on file.  Social History   Tobacco Use  . Smoking status: Current Every Day Smoker    Packs/day: 1.00    Types: Cigarettes  . Smokeless tobacco: Never Used  Vaping Use  . Vaping Use: Never used  Substance Use Topics  . Alcohol use: Yes    Comment: daily  . Drug use: No  lives with spouse  Home Medications Prior to Admission medications   Medication Sig Start Date End Date Taking? Authorizing Provider  famotidine (PEPCID) 20 MG tablet Take 1 tablet (20 mg total) by mouth 2 (two) times daily. 01/22/21  Yes Rolland Porter, MD  predniSONE (DELTASONE) 20 MG tablet Take 3 po QD x 3d , then 2 po QD x 3d then 1 po QD x 3d 01/22/21  Yes Rolland Porter, MD  ALPRAZolam Duanne Moron) 0.5 MG tablet Take 0.5 mg by mouth daily as needed. 08/14/19   [provider]  buPROPion (WELLBUTRIN XL) 150 MG 24 hr tablet  08/29/19   [provider]  cholecalciferol (VITAMIN D) 1000 UNITS tablet Take 1,000 Units by mouth daily.    [provider]  ezetimibe (ZETIA) 10 MG tablet TAKE 1 TABLET BY MOUTH EVERY DAY FOR CHOLESTEROL 08/18/19   [provider]  fluticasone (FLONASE) 50 MCG/ACT nasal spray INSTILL 1 TO 2 SPRAYS IN EACH NOSTRIL ONCE A DAY 08/09/19   [provider]  furosemide (LASIX) 20 MG tablet TAKE 1 TABLET BY MOUTH EVERY DAY AS NEEDED FOR SWELLING. DO NOT EXCEED 1 PER DAY. 08/18/19   [provider]  losartan (COZAAR) 25 MG tablet  08/29/19   [provider]  naproxen sodium (ALEVE) 220 MG tablet Take 220 mg by mouth.    [provider]  vitamin B-12 (CYANOCOBALAMIN) 100 MCG tablet Take 100 mcg by mouth daily.    [provider]    Allergies    Patient has no known allergies.  Review of Systems   Review of Systems  All other systems reviewed and are negative.   Physical Exam Updated Vital Signs BP 130/76 (BP Location: Right Arm)   Pulse 88   Temp 97.7 F (36.5 C) (Oral)   Resp 16  Ht 6\' 1"  (1.854 m)   Wt 94.8 kg   SpO2 96%   BMI 27.57 kg/m   Physical Exam Vitals and nursing note reviewed.  Constitutional:      General: He is not in acute distress.    Appearance: Normal appearance. He is normal weight. He is not ill-appearing or toxic-appearing.  HENT:     Head: Normocephalic and atraumatic.     Right Ear: External ear normal.     Left Ear: External ear normal.     Mouth/Throat:     Mouth: Mucous membranes are moist.     Pharynx: Oropharynx is clear.     Comments: Uvula is midline, there is no soft palate swelling, voice is normal Eyes:     Extraocular Movements: Extraocular movements intact.     Conjunctiva/sclera: Conjunctivae normal.     Pupils: Pupils are equal, round, and reactive to light.  Cardiovascular:     Rate and Rhythm: Normal rate and regular rhythm.     Pulses: Normal pulses.     Heart sounds: Normal heart sounds.   Pulmonary:     Effort: Pulmonary effort is normal. No respiratory distress.     Breath sounds: Normal breath sounds. No stridor. No wheezing, rhonchi or rales.  Musculoskeletal:        General: Normal range of motion.     Cervical back: Normal range of motion.  Skin:    General: Skin is warm and dry.     Findings: Rash present.          Comments: Currently the only thing the patient has is an area of redness about 2-3 sizes of his palm over the left lateral flank area.  Neurological:     General: No focal deficit present.     Mental Status: He is alert and oriented to person, place, and time.     Cranial Nerves: No cranial nerve deficit.  Psychiatric:        Mood and Affect: Mood normal.        Behavior: Behavior normal.        Thought Content: Thought content normal.     ED Results / Procedures / Treatments   Labs (all labs ordered are listed, but only abnormal results are displayed) Labs Reviewed  CBG MONITORING, ED - Abnormal; Notable for the following components:      Result Value   Glucose-Capillary 116 (*)    All other components within normal limits    Laboratory interpretation all normal except nonfasting hyperglycemia   EKG None  Radiology No results found.  Procedures Procedures   Medications Ordered in ED Medications  famotidine (PEPCID) tablet 20 mg (20 mg Oral Given 01/22/21 0408)  predniSONE (DELTASONE) tablet 60 mg (60 mg Oral Given 01/22/21 0408)    ED Course  I have reviewed the triage vital signs and the nursing notes.  Pertinent labs & imaging results that were available during my care of the patient were reviewed by me and considered in my medical decision making (see chart for details).    MDM Rules/Calculators/A&P                          Patient already took Benadryl 50 mg at home.  He was given prednisone and Pepcid.  He had said that he had diffuse itching at home however now he has a well localized area on his left lateral  back.  Recheck at 4:25 AM patient states  his itching is gone.  When I look at the area he had before it is starting to fade but still visible.  He feels ready to be discharged home.  Final Clinical Impression(s) / ED Diagnoses Final diagnoses:  Allergic reaction, initial encounter  Urticaria    Rx / DC Orders ED Discharge Orders         Ordered    predniSONE (DELTASONE) 20 MG tablet        01/22/21 0412    famotidine (PEPCID) 20 MG tablet  2 times daily        01/22/21 0412        OTC benadryl, or zyrtec or claritin  Plan discharge  Rolland Porter, MD, Barbette Or, MD 01/22/21 [phone]

## 2021-02-03 ENCOUNTER — Ambulatory Visit
Admission: RE | Admit: 2021-02-03 | Discharge: 2021-02-03 | Disposition: A | Discharge status: Home / Self Care | Payer: Medicare Other | Source: Ambulatory Visit | Attending: Family Medicine | Admitting: Family Medicine

## 2021-02-03 DIAGNOSIS — F17200 Nicotine dependence, unspecified, uncomplicated: Secondary | ICD-10-CM

## 2021-02-06 ENCOUNTER — Telehealth: Payer: Self-pay

## 2021-02-06 NOTE — Telephone Encounter (Signed)
Patient developed back pain over the last 2 days. It is getting better, and denies n/v, abdominal pain. He has a known hx of aneurysm. Moved up patient appt to this week for AAA duplex and follow up. Advised him to get to the ED immediately if back pain became severe.

## 2021-02-07 ENCOUNTER — Other Ambulatory Visit: Payer: Self-pay

## 2021-02-07 DIAGNOSIS — I714 Abdominal aortic aneurysm, without rupture, unspecified: Secondary | ICD-10-CM

## 2021-02-07 DIAGNOSIS — E559 Vitamin D deficiency, unspecified: Secondary | ICD-10-CM | POA: Insufficient documentation

## 2021-02-07 DIAGNOSIS — F1021 Alcohol dependence, in remission: Secondary | ICD-10-CM | POA: Insufficient documentation

## 2021-02-07 DIAGNOSIS — F17200 Nicotine dependence, unspecified, uncomplicated: Secondary | ICD-10-CM | POA: Insufficient documentation

## 2021-02-07 DIAGNOSIS — E291 Testicular hypofunction: Secondary | ICD-10-CM | POA: Insufficient documentation

## 2021-02-07 DIAGNOSIS — R945 Abnormal results of liver function studies: Secondary | ICD-10-CM | POA: Insufficient documentation

## 2021-02-07 DIAGNOSIS — F419 Anxiety disorder, unspecified: Secondary | ICD-10-CM | POA: Insufficient documentation

## 2021-02-07 DIAGNOSIS — I10 Essential (primary) hypertension: Secondary | ICD-10-CM | POA: Insufficient documentation

## 2021-02-07 DIAGNOSIS — J449 Chronic obstructive pulmonary disease, unspecified: Secondary | ICD-10-CM | POA: Insufficient documentation

## 2021-02-07 DIAGNOSIS — F339 Major depressive disorder, recurrent, unspecified: Secondary | ICD-10-CM | POA: Insufficient documentation

## 2021-02-07 DIAGNOSIS — E785 Hyperlipidemia, unspecified: Secondary | ICD-10-CM | POA: Insufficient documentation

## 2021-02-07 DIAGNOSIS — E782 Mixed hyperlipidemia: Secondary | ICD-10-CM | POA: Insufficient documentation

## 2021-02-08 ENCOUNTER — Other Ambulatory Visit: Payer: Self-pay

## 2021-02-08 ENCOUNTER — Ambulatory Visit (INDEPENDENT_AMBULATORY_CARE_PROVIDER_SITE_OTHER): Payer: Medicare Other | Admitting: Physician Assistant

## 2021-02-08 ENCOUNTER — Ambulatory Visit (HOSPITAL_COMMUNITY)
Admission: RE | Admit: 2021-02-08 | Discharge: 2021-02-08 | Disposition: A | Discharge status: Home / Self Care | Payer: Medicare Other | Source: Ambulatory Visit | Attending: Vascular Surgery | Admitting: Vascular Surgery

## 2021-02-08 VITALS — BP 127/77 | HR 96 | Temp 98.6°F | Resp 20 | Ht 73.0 in | Wt 207.3 lb

## 2021-02-08 DIAGNOSIS — I714 Abdominal aortic aneurysm, without rupture, unspecified: Secondary | ICD-10-CM

## 2021-02-08 DIAGNOSIS — M545 Low back pain, unspecified: Secondary | ICD-10-CM | POA: Diagnosis not present

## 2021-02-08 NOTE — Progress Notes (Signed)
Office Note     CC:  follow up Requesting Provider:  Janie Morning, DO  HPI: Anthony Summers is a 65 y.o. (Nov 06, 1956) male who presents with onset of mild left lower back pain 2 days ago.  States this is worse sitting and improved with Aleve.  He states he does not notice this throughout the day.  He denies recent heavy lifting or injury to his back.  No change in appetite bowel or bladder function.  Denies chest pain or abdominal pain.  Denies claudication.   Past Medical History:  Diagnosis Date  . Hyperlipidemia   . Hypertension     Past Surgical History:  Procedure Laterality Date  . ABDOMINAL SURGERY    . SHOULDER SURGERY      Social History   Socioeconomic History  . Marital status: Married    Spouse name: Not on file  . Number of children: Not on file  . Years of education: Not on file  . Highest education level: Not on file  Occupational History  . Not on file  Tobacco Use  . Smoking status: Current Every Day Smoker    Packs/day: 1.00    Types: Cigarettes  . Smokeless tobacco: Never Used  Vaping Use  . Vaping Use: Never used  Substance and Sexual Activity  . Alcohol use: Yes    Comment: daily  . Drug use: No  . Sexual activity: Yes  Other Topics Concern  . Not on file  Social History Narrative  . Not on file   Social Determinants of Health   Financial Resource Strain: Not on file  Food Insecurity: Not on file  Transportation Needs: Not on file  Physical Activity: Not on file  Stress: Not on file  Social Connections: Not on file  Intimate Partner Violence: Not on file   No family history on file.  Current Outpatient Medications  Medication Sig Dispense Refill  . albuterol (VENTOLIN HFA) 108 (90 Base) MCG/ACT inhaler 1 puff as needed    . ALPRAZolam (XANAX) 0.5 MG tablet Take 0.5 mg by mouth daily as needed.    . B Complex Vitamins (B COMPLEX 1 PO) See admin instructions.    Marland Kitchen buPROPion (WELLBUTRIN XL) 150 MG 24 hr tablet     .  cholecalciferol (VITAMIN D) 1000 UNITS tablet Take 1,000 Units by mouth daily.    Marland Kitchen escitalopram (LEXAPRO) 10 MG tablet 1 tablet    . ezetimibe (ZETIA) 10 MG tablet TAKE 1 TABLET BY MOUTH EVERY DAY FOR CHOLESTEROL    . fluticasone (FLONASE) 50 MCG/ACT nasal spray INSTILL 1 TO 2 SPRAYS IN EACH NOSTRIL ONCE A DAY    . furosemide (LASIX) 20 MG tablet TAKE 1 TABLET BY MOUTH EVERY DAY AS NEEDED FOR SWELLING. DO NOT EXCEED 1 PER DAY.    Marland Kitchen losartan (COZAAR) 50 MG tablet Take 1 tablet by mouth daily.    Marland Kitchen losartan-hydrochlorothiazide (HYZAAR) 50-12.5 MG tablet 1/2 tablet    . naproxen sodium (ALEVE) 220 MG tablet Take 220 mg by mouth.    . pseudoephedrine-guaifenesin (MUCINEX D) 60-600 MG 12 hr tablet 1 tablet as needed    . vitamin B-12 (CYANOCOBALAMIN) 100 MCG tablet Take 100 mcg by mouth daily.     Current Facility-Administered Medications  Medication Dose Route Frequency Provider Last Rate Last Admin  . aspirin EC tablet 81 mg  81 mg Oral Daily Fields, Jessy Oto, MD        No Known Allergies   REVIEW OF SYSTEMS:   [  X] denotes positive finding, [ ]  denotes negative finding Cardiac  Comments:  Chest pain or chest pressure:    Shortness of breath upon exertion:    Short of breath when lying flat:    Irregular heart rhythm:        Vascular    Pain in calf, thigh, or hip brought on by ambulation:    Pain in feet at night that wakes you up from your sleep:     Blood clot in your veins:    Leg swelling:         Pulmonary    Oxygen at home:    Productive cough:     Wheezing:         Neurologic    Sudden weakness in arms or legs:     Sudden numbness in arms or legs:     Sudden onset of difficulty speaking or slurred speech:    Temporary loss of vision in one eye:     Problems with dizziness:         Gastrointestinal    Blood in stool:     Vomited blood:         Genitourinary    Burning when urinating:     Blood in urine:        Psychiatric    Major depression:          Hematologic    Bleeding problems:    Problems with blood clotting too easily:        Skin    Rashes or ulcers:        Constitutional    Fever or chills:      PHYSICAL EXAMINATION:  Vitals:   02/08/21 0817  BP: 127/77  Pulse: 96  Resp: 20  Temp: 98.6 F (37 C)  TempSrc: Temporal  SpO2: 96%  Weight: 207 lb 4.8 oz (94 kg)  Height: 6\' 1"  (1.854 m)    General:  WDWN in NAD; vital signs documented above Gait: Unaided no ataxia HENT: WNL, normocephalic Pulmonary: normal non-labored breathing , without Rales, rhonchi,  wheezing Cardiac: regular HR, without  Murmurs   Abdomen: soft, NT, no masses.  Approximately 1 to 2 cm umbilical hernia without incarceration. Skin: without rashes; 2 small healing abrasions of both anterior knees. Vascular Exam/Pulses: 2+ radial, femoral and posterior tibial pulses Extremities: without ischemic changes, without Gangrene , without cellulitis; without open wounds;  Musculoskeletal: no muscle wasting or atrophy  Neurologic: A&O X 3;  No focal weakness or paresthesias are detected Psychiatric:  The pt has Normal affect.   Non-Invasive Vascular Imaging:   02/08/2021 Abdominal Aorta: There is evidence of abnormal dilatation of the mid  Abdominal aorta. The largest aortic measurement is 5.0 cm. The largest  aortic diameter remains essentially unchanged compared to prior exam.  Previous diameter measurement was 4.9 cm  obtained on 11/11/2020. This is based on limited visualization due to  large amounts of overlying bowel gas.     ASSESSMENT/PLAN:: 65 y.o. male here for follow up for onset of low back pain with history of abdominal aortic aneurysm.  There is less than 1 mm change in maximal diameter as compared to study year.  His blood pressure remains in good control.  I do not think this pain is referable to his AAA.  Encouraged continued observation and surveillance.  Discussed follow-up in 3 months and if maximal diameter of abdominal aorta is  stable, would likely return to annual surveillance.   Barbie Banner,  PA-C [facility] [phone]  Clinic MD:   Dr. Oneida Alar

## 2021-02-09 ENCOUNTER — Other Ambulatory Visit: Payer: Self-pay

## 2021-02-09 DIAGNOSIS — I714 Abdominal aortic aneurysm, without rupture, unspecified: Secondary | ICD-10-CM

## 2021-05-09 ENCOUNTER — Other Ambulatory Visit: Payer: Self-pay

## 2021-05-09 ENCOUNTER — Ambulatory Visit (INDEPENDENT_AMBULATORY_CARE_PROVIDER_SITE_OTHER): Payer: Medicare Other | Admitting: Physician Assistant

## 2021-05-09 ENCOUNTER — Ambulatory Visit (HOSPITAL_COMMUNITY)
Admission: RE | Admit: 2021-05-09 | Discharge: 2021-05-09 | Disposition: A | Discharge status: Home / Self Care | Payer: Medicare Other | Source: Ambulatory Visit | Attending: Vascular Surgery | Admitting: Vascular Surgery

## 2021-05-09 VITALS — BP 149/81 | HR 83 | Temp 98.5°F | Resp 20 | Ht 73.0 in | Wt 213.7 lb

## 2021-05-09 DIAGNOSIS — I714 Abdominal aortic aneurysm, without rupture, unspecified: Secondary | ICD-10-CM
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
## 2021-09-21 ENCOUNTER — Other Ambulatory Visit: Payer: Self-pay

## 2021-09-21 DIAGNOSIS — I714 Abdominal aortic aneurysm, without rupture, unspecified: Secondary | ICD-10-CM

## 2021-09-27 ENCOUNTER — Telehealth: Payer: Self-pay

## 2021-09-27 NOTE — Telephone Encounter (Signed)
-----   Message from April H Pait sent at 09/27/2021  9:55 AM EDT ----- Regarding: FW: CTA  ----- Message ----- From: Cathlean Cower Sent: 09/27/2021   9:44 AM EDT To: April H Pait Subject: RE: CTA                                        2nd msg left on vm   ad ----- Message ----- From: Sheldon Silvan, April H Sent: 09/26/2021  11:53 AM EDT To: Cathlean Cower Subject: FW: CTA                                         ----- Message ----- From: Maryann Conners, NT Sent: 09/21/2021   3:58 PM EDT To: April H Pait, Roosvelt Maser, # Subject: RE: CTA                                        FYI   09/21/21-LVM...    Ellison Hughs ----- Message ----- From: Belva Chimes H Sent: 09/21/2021  12:50 PM EDT To: Maryann Conners, NT Subject: FW: CTA                                         ----- Message ----- From: Kaleen Mask, LPN Sent: 3/72/9021  12:20 PM EDT To: April H Pait, Jillyn Hidden, # Subject: CTA                                            Please schedule prior to appt on 11/06/21.  Thanks.

## 2021-11-06 ENCOUNTER — Other Ambulatory Visit: Payer: Self-pay

## 2021-11-06 ENCOUNTER — Ambulatory Visit: Payer: Medicare Other | Admitting: Surgery

## 2021-11-06 DIAGNOSIS — I714 Abdominal aortic aneurysm, without rupture, unspecified: Secondary | ICD-10-CM

## 2021-11-07 ENCOUNTER — Other Ambulatory Visit: Payer: Self-pay

## 2021-11-07 DIAGNOSIS — I714 Abdominal aortic aneurysm, without rupture, unspecified: Secondary | ICD-10-CM

## 2021-11-13 ENCOUNTER — Ambulatory Visit (HOSPITAL_COMMUNITY)
Admission: RE | Admit: 2021-11-13 | Discharge: 2021-11-13 | Disposition: A | Discharge status: Home / Self Care | Payer: Medicare Other | Source: Ambulatory Visit | Attending: Surgery | Admitting: Surgery

## 2021-11-13 ENCOUNTER — Encounter (HOSPITAL_COMMUNITY): Payer: Self-pay

## 2021-11-13 DIAGNOSIS — I714 Abdominal aortic aneurysm, without rupture, unspecified: Secondary | ICD-10-CM | POA: Diagnosis present

## 2021-11-13 LAB — POCT I-STAT CREATININE: Creatinine, Ser: 0.6 mg/dL — ABNORMAL LOW (ref 0.61–1.24)

## 2021-11-13 MED ORDER — IOHEXOL 350 MG/ML SOLN
100.0000 mL | Freq: Once | INTRAVENOUS | Status: AC | PRN
  Administered 2021-11-13: 100 mL via INTRAVENOUS

## 2021-12-04 ENCOUNTER — Ambulatory Visit: Payer: Medicare Other | Admitting: Surgery

## 2021-12-24 DIAGNOSIS — I6523 Occlusion and stenosis of bilateral carotid arteries: Secondary | ICD-10-CM

## 2021-12-24 HISTORY — DX: Occlusion and stenosis of bilateral carotid arteries: I65.23

## 2022-01-01 ENCOUNTER — Other Ambulatory Visit (HOSPITAL_COMMUNITY): Payer: Self-pay | Admitting: Surgery

## 2022-01-01 ENCOUNTER — Encounter: Payer: Self-pay | Admitting: Surgery

## 2022-01-01 ENCOUNTER — Ambulatory Visit (INDEPENDENT_AMBULATORY_CARE_PROVIDER_SITE_OTHER): Payer: Medicare Other | Admitting: Surgery

## 2022-01-01 ENCOUNTER — Ambulatory Visit (HOSPITAL_COMMUNITY)
Admission: RE | Admit: 2022-01-01 | Discharge: 2022-01-01 | Disposition: A | Discharge status: Home / Self Care | Payer: Medicare Other | Source: Ambulatory Visit | Attending: Surgery | Admitting: Surgery

## 2022-01-01 ENCOUNTER — Other Ambulatory Visit: Payer: Self-pay

## 2022-01-01 VITALS — BP 134/75 | HR 78 | Temp 98.2°F | Resp 20 | Ht 73.0 in | Wt 218.0 lb

## 2022-01-01 DIAGNOSIS — Z01818 Encounter for other preprocedural examination: Secondary | ICD-10-CM | POA: Diagnosis present

## 2022-01-01 DIAGNOSIS — I7143 Infrarenal abdominal aortic aneurysm, without rupture: Secondary | ICD-10-CM | POA: Diagnosis not present
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
## 2022-01-02 ENCOUNTER — Ambulatory Visit (INDEPENDENT_AMBULATORY_CARE_PROVIDER_SITE_OTHER): Payer: Medicare Other | Admitting: Internal Medicine

## 2022-01-02 ENCOUNTER — Encounter: Payer: Self-pay | Admitting: Internal Medicine

## 2022-01-02 VITALS — BP 136/68 | HR 96 | Ht 73.0 in | Wt 217.0 lb

## 2022-01-02 DIAGNOSIS — Z01818 Encounter for other preprocedural examination: Secondary | ICD-10-CM

## 2022-01-02 DIAGNOSIS — I493 Ventricular premature depolarization: Secondary | ICD-10-CM

## 2022-01-02 DIAGNOSIS — I714 Abdominal aortic aneurysm, without rupture, unspecified: Secondary | ICD-10-CM

## 2022-01-02 DIAGNOSIS — Z01810 Encounter for preprocedural cardiovascular examination: Secondary | ICD-10-CM

## 2022-01-02 NOTE — Progress Notes (Signed)
Cardiology Office Note:    Date:  01/02/2022   ID:  [name], DOB 1956-05-17, MRN [id number]  PCP:  Janie Morning, DO   [facility] Providers Cardiologist:  Werner Lean, MD     Referring MD: Serafina Mitchell, MD   CC: Get seen prior to surgery Consulted for the evaluation of preop prior to EVAR at the behest of Janie Morning, DO Dr. Trula Slade  History of Present Illness:    Anthony Summers is a 66 y.o. male with a hx of HTN, HLD, COPD and active tobacco use, Mild bilateral CAS, Severe AAA who presents for evaluation 01/02/22.  Patient notes that he is feeling Ok.  Has had no chest pain, chest pressure, chest tightness, chest stinging .  Patient exertion notable for being active with his dog and his house in [place]- does over 12,000 steps with him a day.  Dose over 4 miles a dayand feels no symptoms.  No shortness of breath, DOE .  No PND or orthopnea.  No weight gain, leg swelling , or abdominal swelling.  No syncope or near syncope . Notes  no palpitations or funny heart beats.     Notes that as long as he takes is Flonase, he breathes fine.  Notes distant history of chest pain; with negative work up (precipitated by scrotal pain after a long drive).  Asymptomatic of PVCs.   Past Medical History:  Diagnosis Date   Hyperlipidemia    Hypertension     Past Surgical History:  Procedure Laterality Date   ABDOMINAL SURGERY     SHOULDER SURGERY      Current Medications: Current Meds  Medication Sig   albuterol (VENTOLIN HFA) 108 (90 Base) MCG/ACT inhaler 1 puff as needed   ALPRAZolam (XANAX) 0.5 MG tablet Take 0.5 mg by mouth daily as needed.   B Complex Vitamins (B COMPLEX 1 PO) See admin instructions.   cholecalciferol (VITAMIN D) 1000 UNITS tablet Take 1,000 Units by mouth daily.   ezetimibe (ZETIA) 10 MG tablet TAKE 1 TABLET BY MOUTH EVERY DAY FOR CHOLESTEROL   fluticasone (FLONASE) 50 MCG/ACT nasal spray INSTILL 1 TO 2 SPRAYS IN EACH NOSTRIL  ONCE A DAY   furosemide (LASIX) 20 MG tablet TAKE 1 TABLET BY MOUTH EVERY DAY AS NEEDED FOR SWELLING. DO NOT EXCEED 1 PER DAY.   losartan (COZAAR) 50 MG tablet Take 1 tablet by mouth daily.   naproxen sodium (ALEVE) 220 MG tablet Take 220 mg by mouth.   vitamin B-12 (CYANOCOBALAMIN) 100 MCG tablet Take 100 mcg by mouth daily.   [DISCONTINUED] buPROPion (WELLBUTRIN XL) 150 MG 24 hr tablet    [DISCONTINUED] escitalopram (LEXAPRO) 10 MG tablet 1 tablet   [DISCONTINUED] losartan-hydrochlorothiazide (HYZAAR) 50-12.5 MG tablet    [DISCONTINUED] pseudoephedrine-guaifenesin (MUCINEX D) 60-600 MG 12 hr tablet 1 tablet as needed   [DISCONTINUED] tamsulosin (FLOMAX) 0.4 MG CAPS capsule Take 0.4 mg by mouth daily.   [DISCONTINUED] valACYclovir (VALTREX) 1000 MG tablet Take 1,000 mg by mouth every 8 (eight) hours.   Current Facility-Administered Medications for the 01/02/22 encounter (Office Visit) with Werner Lean, MD  Medication   aspirin EC tablet 81 mg     Allergies:   Patient has no known allergies.   Social History   Socioeconomic History   Marital status: Married    Spouse name: Not on file   Number of children: Not on file   Years of education: Not on file   Highest education level:  Not on file  Occupational History   Not on file  Tobacco Use   Smoking status: Every Day    Packs/day: 1.00    Types: Cigarettes   Smokeless tobacco: Never  Vaping Use   Vaping Use: Never used  Substance and Sexual Activity   Alcohol use: Yes    Comment: daily   Drug use: No   Sexual activity: Yes  Other Topics Concern   Not on file  Social History Narrative   Not on file   Social Determinants of Health   Financial Resource Strain: Not on file  Food Insecurity: Not on file  Transportation Needs: Not on file  Physical Activity: Not on file  Stress: Not on file  Social Connections: Not on file     Family History: History of coronary artery disease notable for mother 2 PCI at  age 47. History of heart failure notable for no members. History of arrhythmia notable for no members.   ROS:   Please see the history of present illness.     All other systems reviewed and are negative.  EKGs/Labs/Other Studies Reviewed:    The following studies were reviewed today:   EKG:  EKG is  ordered today.  The ekg ordered today demonstrates  01/02/22 01/02/22: SR 96 occasional PVCs  Lung CT: Date: 02/03/21 Results: Aortic atherosclerosis 3VD CAC   Recent Labs: 11/13/2021: Creatinine, Ser 0.60  Recent Lipid Panel No results found for: CHOL, TRIG, HDL, CHOLHDL, VLDL, LDLCALC, LDLDIRECT       Physical Exam:    VS:  BP 136/68    Pulse 96    Ht 6\' 1"  (1.854 m)    Wt 98.4 kg    SpO2 98%    BMI 28.63 kg/m     Wt Readings from Last 3 Encounters:  01/02/22 98.4 kg  01/01/22 98.9 kg  05/09/21 96.9 kg     Gen: No distress Neck: No JVD, no carotid bruit Cardiac: No Rubs or Gallops, no Murmur, regular with occasional irregular beat+2radial pulses Respiratory: Clear to auscultation bilaterally, normal effort, normal  respiratory rate GI: Soft, nontender, non-distended  MS: No  edema;  moves all extremities Integument: Skin feels warm Neuro:  At time of evaluation, alert and oriented to person/place/time/situation  Psych: Normal affect, patient feels fine   ASSESSMENT:    1. Pre-operative clearance   2. PVC (premature ventricular contraction)   3. Abdominal aortic aneurysm (AAA) without rupture, unspecified part    PLAN:    AAA HTN HLD PVCs- occasional monomorphic, and asymptomatic Active smoking Aortic atherosclerosis and CAC Preoperative Risk Assessment - The Revised Cardiac Risk Index = 1 based only on supraingual vascular surgery, he has greater than 5 fMETS and does not meet guideline criteria for coronary testing - given his notable CAC, we discussed the pros and cons of stress testing - we settled on starting with an echocardiogram; if he is  asymptomatic with preserved EF and no WMAs, we will defer ischemic testing and do PVC assessment post echo (Reason to go to surgery) - will see post surgery:  we have discussed smoking cessation and reviewed his coronary calcium, patient has had statin myalgias in the past and will send him for eval for inclisiran vs PCSK9i - discussed ASA 81 mg PO daily for post op - discussed heart monitor for PVC assessment post op or based on LVEF - discussed smoking cessation   Post op follow up with me  Medication Adjustments/Labs and Tests Ordered: Current medicines are reviewed at length with the patient today.  Concerns regarding medicines are outlined above.  Orders Placed This Encounter  Procedures   EKG 12-Lead   ECHOCARDIOGRAM COMPLETE   No orders of the defined types were placed in this encounter.   Patient Instructions  Medication Instructions:  Your physician recommends that you continue on your current medications as directed. Please refer to the Current Medication list given to you today.  *If you need a refill on your cardiac medications before your next appointment, please call your pharmacy*   Lab Work: NONE If you have labs (blood work) drawn today and your tests are completely normal, you will receive your results only by: [place] (if you have MyChart) OR A paper copy in the mail If you have any lab test that is abnormal or we need to change your treatment, we will call you to review the results.   Testing/Procedures: Your physician has requested that you have an echocardiogram. Echocardiography is a painless test that uses sound waves to create images of your heart. It provides your doctor with information about the size and shape of your heart and how well your hearts chambers and valves are working. This procedure takes approximately one hour. There are no restrictions for this procedure.    Follow-Up: At [facility], you and your health needs are  our priority.  As part of our continuing mission to provide you with exceptional heart care, we have created designated Provider Care Teams.  These Care Teams include your primary Cardiologist (physician) and Advanced Practice Providers (APPs -  Physician Assistants and Nurse Practitioners) who all work together to provide you with the care you need, when you need it.  Your next appointment:   4 -5 month(s) after your surgery   The format for your next appointment:   In Person  Provider:   Werner Lean, MD       Signed, Werner Lean, MD  01/02/2022 5:32 PM    [place]

## 2022-01-02 NOTE — Patient Instructions (Signed)
Medication Instructions:  Your physician recommends that you continue on your current medications as directed. Please refer to the Current Medication list given to you today.  *If you need a refill on your cardiac medications before your next appointment, please call your pharmacy*   Lab Work: NONE If you have labs (blood work) drawn today and your tests are completely normal, you will receive your results only by: [place] (if you have MyChart) OR A paper copy in the mail If you have any lab test that is abnormal or we need to change your treatment, we will call you to review the results.   Testing/Procedures: Your physician has requested that you have an echocardiogram. Echocardiography is a painless test that uses sound waves to create images of your heart. It provides your doctor with information about the size and shape of your heart and how well your hearts chambers and valves are working. This procedure takes approximately one hour. There are no restrictions for this procedure.    Follow-Up: At [facility], you and your health needs are our priority.  As part of our continuing mission to provide you with exceptional heart care, we have created designated Provider Care Teams.  These Care Teams include your primary Cardiologist (physician) and Advanced Practice Providers (APPs -  Physician Assistants and Nurse Practitioners) who all work together to provide you with the care you need, when you need it.  Your next appointment:   4 -5 month(s) after your surgery   The format for your next appointment:   In Person  Provider:   Werner Lean, MD

## 2022-01-16 ENCOUNTER — Ambulatory Visit (HOSPITAL_COMMUNITY): Payer: Medicare Other | Attending: Cardiology

## 2022-01-16 ENCOUNTER — Other Ambulatory Visit: Payer: Self-pay

## 2022-01-16 DIAGNOSIS — Z01818 Encounter for other preprocedural examination: Secondary | ICD-10-CM

## 2022-01-16 DIAGNOSIS — I5030 Unspecified diastolic (congestive) heart failure: Secondary | ICD-10-CM | POA: Diagnosis not present

## 2022-01-16 DIAGNOSIS — I493 Ventricular premature depolarization: Secondary | ICD-10-CM | POA: Diagnosis present

## 2022-01-16 DIAGNOSIS — I714 Abdominal aortic aneurysm, without rupture, unspecified: Secondary | ICD-10-CM

## 2022-01-16 LAB — ECHOCARDIOGRAM COMPLETE
Area-P 1/2: 3.93 cm2
S' Lateral: 2 cm

## 2022-01-17 ENCOUNTER — Other Ambulatory Visit: Payer: Self-pay

## 2022-01-17 DIAGNOSIS — I7143 Infrarenal abdominal aortic aneurysm, without rupture: Secondary | ICD-10-CM

## 2022-01-17 DIAGNOSIS — Z419 Encounter for procedure for purposes other than remedying health state, unspecified: Secondary | ICD-10-CM

## 2022-01-19 NOTE — Progress Notes (Signed)
Surgical Instructions    Your procedure is scheduled on Wednesday February 1st.  Report to [facility] Main Entrance "A" at 6:30 A.M., then check in with the Admitting office.  Call this number if you have problems the morning of surgery:  [phone]   If you have any questions prior to your surgery date call [phone]: Open Monday-Friday 8am-4pm    Remember:  Do not eat or drink after midnight the night before your surgery      Take these medicines the morning of surgery with A SIP OF WATER ezetimibe (ZETIA) 10 MG tablet    IF NEEDED  albuterol (VENTOLIN HFA) 108 (90 Base) MCG/ACT inhaler - please bring with you to the hospital fluticasone (FLONASE) 50 MCG/ACT nasal spray   As of today, STOP taking any Aspirin (unless otherwise instructed by your surgeon) Aleve, Naproxen, Ibuprofen, Motrin, Advil, Goody's, BC's, all herbal medications, fish oil, and all vitamins.  After your COVID test   You are not required to quarantine however you are required to wear a well-fitting mask when you are out and around people not in your household.  If your mask becomes wet or soiled, replace with a new one.  Wash your hands often with soap and water for 20 seconds or clean your hands with an alcohol-based hand sanitizer that contains at least 60% alcohol.  Do not share personal items.  Notify your provider: if you are in close contact with someone who has COVID  or if you develop a fever of 100.4 or greater, sneezing, cough, sore throat, shortness of breath or body aches.           Do not wear jewelry  Do not wear lotions, powders, colognes, or deodorant. Do not shave 48 hours prior to surgery.  Men may shave face and neck. Do not bring valuables to the hospital. DO Not wear nail polish, gel polish, artificial nails, or any other type of covering on natural nails (fingers and toes) If you have artificial nails or gel coating that need to be removed by a nail salon, please have this  removed prior to surgery. Artificial nails or gel coating may interfere with anesthesia's ability to adequately monitor your vital signs.             [facility] is not responsible for any belongings or valuables.  Do NOT Smoke (Tobacco/Vaping)  24 hours prior to your procedure  If you use a CPAP at night, you may bring your mask for your overnight stay.   Contacts, glasses, hearing aids, dentures or partials may not be worn into surgery, please bring cases for these belongings   For patients admitted to the hospital, discharge time will be determined by your treatment team.   Patients discharged the day of surgery will not be allowed to drive home, and someone needs to stay with them for 24 hours.  NO VISITORS WILL BE ALLOWED IN PRE-OP WHERE PATIENTS ARE PREPPED FOR SURGERY.  ONLY 1 SUPPORT PERSON MAY BE PRESENT IN THE WAITING ROOM WHILE YOU ARE IN SURGERY.  IF YOU ARE TO BE ADMITTED, ONCE YOU ARE IN YOUR ROOM YOU WILL BE ALLOWED TWO (2) VISITORS. 1 (ONE) VISITOR MAY STAY OVERNIGHT BUT MUST ARRIVE TO THE ROOM BY 8pm.  Minor children may have two parents present. Special consideration for safety and communication needs will be reviewed on a case by case basis.  Special instructions:    Oral Hygiene is also important to reduce your risk of  infection.  Remember - BRUSH YOUR TEETH THE MORNING OF SURGERY WITH YOUR REGULAR TOOTHPASTE   [place]- Preparing For Surgery  Before surgery, you can play an important role. Because skin is not sterile, your skin needs to be as free of germs as possible. You can reduce the number of germs on your skin by washing with CHG (chlorahexidine gluconate) Soap before surgery.  CHG is an antiseptic cleaner which kills germs and bonds with the skin to continue killing germs even after washing.     Please do not use if you have an allergy to CHG or antibacterial soaps. If your skin becomes reddened/irritated stop using the CHG.  Do not shave (including legs and  underarms) for at least 48 hours prior to first CHG shower. It is OK to shave your face.  Please follow these instructions carefully.     Shower the NIGHT BEFORE SURGERY and the MORNING OF SURGERY with CHG Soap.   If you chose to wash your hair, wash your hair first as usual with your normal shampoo. After you shampoo, rinse your hair and body thoroughly to remove the shampoo.  Then [employer] and genitals (private parts) with your normal soap and rinse thoroughly to remove soap.  After that Use CHG Soap as you would any other liquid soap. You can apply CHG directly to the skin and wash gently with a scrungie or a clean washcloth.   Apply the CHG Soap to your body ONLY FROM THE NECK DOWN.  Do not use on open wounds or open sores. Avoid contact with your eyes, ears, mouth and genitals (private parts). Wash Face and genitals (private parts)  with your normal soap.   Wash thoroughly, paying special attention to the area where your surgery will be performed.  Thoroughly rinse your body with warm water from the neck down.  DO NOT shower/wash with your normal soap after using and rinsing off the CHG Soap.  Pat yourself dry with a CLEAN TOWEL.  Wear CLEAN PAJAMAS to bed the night before surgery  Place CLEAN SHEETS on your bed the night before your surgery  DO NOT SLEEP WITH PETS.   Day of Surgery:  Take a shower with CHG soap. Wear Clean/Comfortable clothing the morning of surgery Do not apply any deodorants/lotions.   Remember to brush your teeth WITH YOUR REGULAR TOOTHPASTE.   Please read over the following fact sheets that you were given.

## 2022-01-22 ENCOUNTER — Encounter (HOSPITAL_COMMUNITY): Payer: Self-pay

## 2022-01-22 ENCOUNTER — Encounter (HOSPITAL_COMMUNITY)
Admission: RE | Admit: 2022-01-22 | Discharge: 2022-01-22 | Disposition: A | Discharge status: Home / Self Care | Payer: Medicare Other | Source: Ambulatory Visit | Attending: Surgery | Admitting: Surgery

## 2022-01-22 ENCOUNTER — Other Ambulatory Visit: Payer: Self-pay

## 2022-01-22 VITALS — BP 159/84 | HR 86 | Temp 98.3°F | Resp 18 | Ht 73.0 in | Wt 217.1 lb

## 2022-01-22 DIAGNOSIS — Z01812 Encounter for preprocedural laboratory examination: Secondary | ICD-10-CM | POA: Insufficient documentation

## 2022-01-22 DIAGNOSIS — Z20822 Contact with and (suspected) exposure to covid-19: Secondary | ICD-10-CM | POA: Insufficient documentation

## 2022-01-22 DIAGNOSIS — Z419 Encounter for procedure for purposes other than remedying health state, unspecified: Secondary | ICD-10-CM

## 2022-01-22 DIAGNOSIS — I7143 Infrarenal abdominal aortic aneurysm, without rupture: Secondary | ICD-10-CM

## 2022-01-22 DIAGNOSIS — Z01818 Encounter for other preprocedural examination: Secondary | ICD-10-CM

## 2022-01-22 HISTORY — DX: Pneumonia, unspecified organism: J18.9

## 2022-01-22 LAB — COMPREHENSIVE METABOLIC PANEL
ALT: 37 U/L (ref 0–44)
AST: 29 U/L (ref 15–41)
Albumin: 4 g/dL (ref 3.5–5.0)
Alkaline Phosphatase: 57 U/L (ref 38–126)
Anion gap: 14 (ref 5–15)
BUN: 12 mg/dL (ref 8–23)
CO2: 30 mmol/L (ref 22–32)
Calcium: 9 mg/dL (ref 8.9–10.3)
Chloride: 94 mmol/L — ABNORMAL LOW (ref 98–111)
Creatinine, Ser: 0.99 mg/dL (ref 0.61–1.24)
GFR, Estimated: >60 mL/min (ref >60)
Glucose, Bld: 138 mg/dL — ABNORMAL HIGH (ref 70–99)
Potassium: 4.2 mmol/L (ref 3.5–5.1)
Sodium: 138 mmol/L (ref 135–145)
Total Bilirubin: 0.8 mg/dL (ref 0.3–1.2)
Total Protein: 7.1 g/dL (ref 6.5–8.1)

## 2022-01-22 LAB — URINALYSIS, ROUTINE W REFLEX MICROSCOPIC
Bilirubin Urine: NEGATIVE
Glucose, UA: NEGATIVE mg/dL
Hgb urine dipstick: NEGATIVE
Ketones, ur: NEGATIVE mg/dL
Leukocytes,Ua: NEGATIVE
Nitrite: NEGATIVE
Protein, ur: NEGATIVE mg/dL
Specific Gravity, Urine: 1.01 (ref 1.005–1.030)
pH: 7 (ref 5.0–8.0)

## 2022-01-22 LAB — TYPE AND SCREEN
ABO/RH(D): O POS
Antibody Screen: NEGATIVE

## 2022-01-22 LAB — CBC
HCT: 44.4 % (ref 39.0–52.0)
Hemoglobin: 15.2 g/dL (ref 13.0–17.0)
MCH: 34.7 pg — ABNORMAL HIGH (ref 26.0–34.0)
MCHC: 34.2 g/dL (ref 30.0–36.0)
MCV: 101.4 fL — ABNORMAL HIGH (ref 80.0–100.0)
Platelets: 168 10*3/uL (ref 150–400)
RBC: 4.38 MIL/uL (ref 4.22–5.81)
RDW: 13.2 % (ref 11.5–15.5)
WBC: 9.7 10*3/uL (ref 4.0–10.5)
nRBC: 0 % (ref 0.0–0.2)

## 2022-01-22 LAB — PROTIME-INR
INR: 0.9 (ref 0.8–1.2)
Prothrombin Time: 11.6 seconds (ref 11.4–15.2)

## 2022-01-22 LAB — SURGICAL PCR SCREEN
MRSA, PCR: NEGATIVE
Staphylococcus aureus: NEGATIVE

## 2022-01-22 LAB — APTT: aPTT: 24 seconds (ref 24–36)

## 2022-01-22 NOTE — Progress Notes (Signed)
PCP - Janie Morning DO Cardiologist - Werner Lean, MD  Chest x-ray - NOt indicated EKG - 01/02/22 Stress Test - Many years ago no f/u needed ECHO - 01/16/22 Cardiac Cath - Denies  Sleep Study - Denies  DM - Denies  COVID TEST- 01/22/22   Anesthesia review: Yes cardiac history  Patient denies shortness of breath, fever, cough and chest pain at PAT appointment   All instructions explained to the patient, with a verbal understanding of the material. Patient agrees to go over the instructions while at home for a better understanding. Patient also instructed to wear a mask while in public after being tested for COVID-19. The opportunity to ask questions was provided.

## 2022-01-23 LAB — SARS CORONAVIRUS 2 (TAT 6-24 HRS): SARS Coronavirus 2: NEGATIVE

## 2022-01-24 ENCOUNTER — Inpatient Hospital Stay (HOSPITAL_COMMUNITY): Payer: Medicare Other

## 2022-01-24 ENCOUNTER — Encounter (HOSPITAL_COMMUNITY)
Admission: RE | Disposition: A | Discharge status: Home / Self Care | Payer: Self-pay | Source: Home / Self Care | Attending: Surgery

## 2022-01-24 ENCOUNTER — Inpatient Hospital Stay (HOSPITAL_COMMUNITY)
Admission: RE | Admit: 2022-01-24 | Discharge: 2022-01-25 | DRG: 269 | Disposition: A | Discharge status: Home / Self Care | Payer: Medicare Other | Attending: Surgery | Admitting: Surgery

## 2022-01-24 ENCOUNTER — Other Ambulatory Visit: Payer: Self-pay

## 2022-01-24 ENCOUNTER — Encounter (HOSPITAL_COMMUNITY): Payer: Self-pay | Admitting: Surgery

## 2022-01-24 ENCOUNTER — Inpatient Hospital Stay (HOSPITAL_COMMUNITY): Payer: Medicare Other | Admitting: Physician Assistant

## 2022-01-24 ENCOUNTER — Inpatient Hospital Stay (HOSPITAL_COMMUNITY): Payer: Medicare Other | Admitting: Certified Registered"

## 2022-01-24 DIAGNOSIS — F17210 Nicotine dependence, cigarettes, uncomplicated: Secondary | ICD-10-CM | POA: Diagnosis not present

## 2022-01-24 DIAGNOSIS — Z9889 Other specified postprocedural states: Secondary | ICD-10-CM

## 2022-01-24 DIAGNOSIS — Z20822 Contact with and (suspected) exposure to covid-19: Secondary | ICD-10-CM | POA: Diagnosis not present

## 2022-01-24 DIAGNOSIS — E7800 Pure hypercholesterolemia, unspecified: Secondary | ICD-10-CM | POA: Diagnosis present

## 2022-01-24 DIAGNOSIS — I7143 Infrarenal abdominal aortic aneurysm, without rupture: Principal | ICD-10-CM | POA: Diagnosis present

## 2022-01-24 DIAGNOSIS — Z79899 Other long term (current) drug therapy: Secondary | ICD-10-CM | POA: Diagnosis not present

## 2022-01-24 DIAGNOSIS — I10 Essential (primary) hypertension: Secondary | ICD-10-CM | POA: Diagnosis present

## 2022-01-24 DIAGNOSIS — Z8679 Personal history of other diseases of the circulatory system: Secondary | ICD-10-CM

## 2022-01-24 DIAGNOSIS — I714 Abdominal aortic aneurysm, without rupture, unspecified: Secondary | ICD-10-CM

## 2022-01-24 HISTORY — PX: ULTRASOUND GUIDANCE FOR VASCULAR ACCESS: SHX6516

## 2022-01-24 HISTORY — DX: Personal history of other diseases of the circulatory system: Z86.79

## 2022-01-24 HISTORY — PX: ABDOMINAL AORTIC ENDOVASCULAR STENT GRAFT: SHX5707

## 2022-01-24 LAB — BASIC METABOLIC PANEL
Anion gap: 8 (ref 5–15)
BUN: 10 mg/dL (ref 8–23)
CO2: 27 mmol/L (ref 22–32)
Calcium: 8.6 mg/dL — ABNORMAL LOW (ref 8.9–10.3)
Chloride: 104 mmol/L (ref 98–111)
Creatinine, Ser: 0.8 mg/dL (ref 0.61–1.24)
GFR, Estimated: >60 mL/min (ref >60)
Glucose, Bld: 132 mg/dL — ABNORMAL HIGH (ref 70–99)
Potassium: 4 mmol/L (ref 3.5–5.1)
Sodium: 139 mmol/L (ref 135–145)

## 2022-01-24 LAB — CBC
HCT: 40.8 % (ref 39.0–52.0)
Hemoglobin: 14.4 g/dL (ref 13.0–17.0)
MCH: 35.3 pg — ABNORMAL HIGH (ref 26.0–34.0)
MCHC: 35.3 g/dL (ref 30.0–36.0)
MCV: 100 fL (ref 80.0–100.0)
Platelets: 147 10*3/uL — ABNORMAL LOW (ref 150–400)
RBC: 4.08 MIL/uL — ABNORMAL LOW (ref 4.22–5.81)
RDW: 13.4 % (ref 11.5–15.5)
WBC: 10 10*3/uL (ref 4.0–10.5)
nRBC: 0 % (ref 0.0–0.2)

## 2022-01-24 LAB — POCT ACTIVATED CLOTTING TIME: Activated Clotting Time: 227 seconds

## 2022-01-24 LAB — PROTIME-INR
INR: 1 (ref 0.8–1.2)
Prothrombin Time: 12.6 seconds (ref 11.4–15.2)

## 2022-01-24 LAB — APTT: aPTT: 27 seconds (ref 24–36)

## 2022-01-24 LAB — MAGNESIUM: Magnesium: 2.1 mg/dL (ref 1.7–2.4)

## 2022-01-24 SURGERY — INSERTION, ENDOVASCULAR STENT GRAFT, AORTA, ABDOMINAL
Anesthesia: General | Site: Groin | Laterality: Bilateral

## 2022-01-24 MED ORDER — ASPIRIN EC 81 MG PO TBEC
81.0000 mg | DELAYED_RELEASE_TABLET | Freq: Every day | ORAL | Status: DC
  Administered 2022-01-25: 81 mg via ORAL
  Filled 2022-01-24: qty 1

## 2022-01-24 MED ORDER — ACETAMINOPHEN 650 MG RE SUPP: 325.0000 mg | RECTAL | Status: DC | PRN

## 2022-01-24 MED ORDER — LACTATED RINGERS IV SOLN
INTRAVENOUS | Status: DC | PRN
Start: 2022-01-24 — End: 2022-01-24

## 2022-01-24 MED ORDER — HYDRALAZINE HCL 20 MG/ML IJ SOLN: 5.0000 mg | INTRAMUSCULAR | Status: DC | PRN

## 2022-01-24 MED ORDER — HEPARIN SODIUM (PORCINE) 1000 UNIT/ML IJ SOLN
INTRAMUSCULAR | Status: DC | PRN
  Administered 2022-01-24: 9000 [IU] via INTRAVENOUS

## 2022-01-24 MED ORDER — LABETALOL HCL 5 MG/ML IV SOLN: 10.0000 mg | INTRAVENOUS | Status: DC | PRN

## 2022-01-24 MED ORDER — HYDROMORPHONE HCL 1 MG/ML IJ SOLN: 0.5000 mg | INTRAMUSCULAR | Status: DC | PRN

## 2022-01-24 MED ORDER — SODIUM CHLORIDE 0.9 % IV SOLN: INTRAVENOUS | Status: DC

## 2022-01-24 MED ORDER — IODIXANOL 320 MG/ML IV SOLN
INTRAVENOUS | Status: DC | PRN
  Administered 2022-01-24: 60 mL via INTRA_ARTERIAL

## 2022-01-24 MED ORDER — ACETAMINOPHEN 325 MG PO TABS: 325.0000 mg | ORAL_TABLET | ORAL | Status: DC | PRN

## 2022-01-24 MED ORDER — FUROSEMIDE 20 MG PO TABS
20.0000 mg | ORAL_TABLET | Freq: Every day | ORAL | Status: DC
  Administered 2022-01-24 – 2022-01-25 (×2): 20 mg via ORAL
  Filled 2022-01-24 (×2): qty 1

## 2022-01-24 MED ORDER — PHENOL 1.4 % MT LIQD: 1.0000 | OROMUCOSAL | Status: DC | PRN

## 2022-01-24 MED ORDER — OXYCODONE-ACETAMINOPHEN 5-325 MG PO TABS: 1.0000 | ORAL_TABLET | ORAL | Status: DC | PRN

## 2022-01-24 MED ORDER — OXYCODONE HCL 5 MG PO TABS: 5.0000 mg | ORAL_TABLET | Freq: Once | ORAL | Status: DC | PRN

## 2022-01-24 MED ORDER — SENNOSIDES-DOCUSATE SODIUM 8.6-50 MG PO TABS: 1.0000 | ORAL_TABLET | Freq: Every evening | ORAL | Status: DC | PRN

## 2022-01-24 MED ORDER — ALBUTEROL SULFATE (2.5 MG/3ML) 0.083% IN NEBU: 3.0000 mL | INHALATION_SOLUTION | Freq: Four times a day (QID) | RESPIRATORY_TRACT | Status: DC | PRN

## 2022-01-24 MED ORDER — FENTANYL CITRATE (PF) 250 MCG/5ML IJ SOLN
INTRAMUSCULAR | Status: AC
  Filled 2022-01-24: qty 5

## 2022-01-24 MED ORDER — CHLORHEXIDINE GLUCONATE 0.12 % MT SOLN
OROMUCOSAL | Status: AC
  Administered 2022-01-24: 15 mL via OROMUCOSAL
  Filled 2022-01-24: qty 15

## 2022-01-24 MED ORDER — LOSARTAN POTASSIUM 50 MG PO TABS
50.0000 mg | ORAL_TABLET | Freq: Every day | ORAL | Status: DC
  Administered 2022-01-24 – 2022-01-25 (×2): 50 mg via ORAL
  Filled 2022-01-24 (×2): qty 1

## 2022-01-24 MED ORDER — LACTATED RINGERS IV SOLN: INTRAVENOUS | Status: DC

## 2022-01-24 MED ORDER — ONDANSETRON HCL 4 MG/2ML IJ SOLN: 4.0000 mg | Freq: Four times a day (QID) | INTRAMUSCULAR | Status: DC | PRN

## 2022-01-24 MED ORDER — ALUM & MAG HYDROXIDE-SIMETH 200-200-20 MG/5ML PO SUSP: 15.0000 mL | ORAL | Status: DC | PRN

## 2022-01-24 MED ORDER — EZETIMIBE 10 MG PO TABS
10.0000 mg | ORAL_TABLET | Freq: Every day | ORAL | Status: DC
  Administered 2022-01-24 – 2022-01-25 (×2): 10 mg via ORAL
  Filled 2022-01-24 (×2): qty 1

## 2022-01-24 MED ORDER — MIDAZOLAM HCL 2 MG/2ML IJ SOLN
INTRAMUSCULAR | Status: DC | PRN
Start: 2022-01-24 — End: 2022-01-24
  Administered 2022-01-24: 2 mg via INTRAVENOUS

## 2022-01-24 MED ORDER — CEFAZOLIN SODIUM-DEXTROSE 2-4 GM/100ML-% IV SOLN
2.0000 g | Freq: Three times a day (TID) | INTRAVENOUS | Status: AC
  Administered 2022-01-24 (×2): 2 g via INTRAVENOUS
  Filled 2022-01-24 (×2): qty 100

## 2022-01-24 MED ORDER — SUGAMMADEX SODIUM 200 MG/2ML IV SOLN
INTRAVENOUS | Status: DC | PRN
  Administered 2022-01-24: 187.8 mg via INTRAVENOUS

## 2022-01-24 MED ORDER — ORAL CARE MOUTH RINSE: 15.0000 mL | Freq: Once | OROMUCOSAL | Status: AC

## 2022-01-24 MED ORDER — DEXAMETHASONE SODIUM PHOSPHATE 10 MG/ML IJ SOLN
INTRAMUSCULAR | Status: DC | PRN
  Administered 2022-01-24: 10 mg via INTRAVENOUS

## 2022-01-24 MED ORDER — PANTOPRAZOLE SODIUM 40 MG PO TBEC
40.0000 mg | DELAYED_RELEASE_TABLET | Freq: Every day | ORAL | Status: DC
  Administered 2022-01-24 – 2022-01-25 (×2): 40 mg via ORAL
  Filled 2022-01-24 (×2): qty 1

## 2022-01-24 MED ORDER — OXYCODONE HCL 5 MG/5ML PO SOLN: 5.0000 mg | Freq: Once | ORAL | Status: DC | PRN

## 2022-01-24 MED ORDER — PROPOFOL 10 MG/ML IV BOLUS
INTRAVENOUS | Status: DC | PRN
  Administered 2022-01-24: 200 mg via INTRAVENOUS

## 2022-01-24 MED ORDER — POTASSIUM CHLORIDE CRYS ER 20 MEQ PO TBCR: 20.0000 meq | EXTENDED_RELEASE_TABLET | Freq: Every day | ORAL | Status: DC | PRN

## 2022-01-24 MED ORDER — LIDOCAINE 2% (20 MG/ML) 5 ML SYRINGE
INTRAMUSCULAR | Status: DC | PRN
  Administered 2022-01-24: 40 mg via INTRAVENOUS

## 2022-01-24 MED ORDER — CHLORHEXIDINE GLUCONATE CLOTH 2 % EX PADS: 6.0000 | MEDICATED_PAD | Freq: Once | CUTANEOUS | Status: DC

## 2022-01-24 MED ORDER — FLUTICASONE PROPIONATE 50 MCG/ACT NA SUSP
1.0000 | Freq: Every day | NASAL | Status: DC | PRN
  Filled 2022-01-24: qty 16

## 2022-01-24 MED ORDER — GUAIFENESIN-DM 100-10 MG/5ML PO SYRP: 15.0000 mL | ORAL_SOLUTION | ORAL | Status: DC | PRN

## 2022-01-24 MED ORDER — 0.9 % SODIUM CHLORIDE (POUR BTL) OPTIME
TOPICAL | Status: DC | PRN
Start: 2022-01-24 — End: 2022-01-24
  Administered 2022-01-24: 1000 mL

## 2022-01-24 MED ORDER — PROPOFOL 500 MG/50ML IV EMUL
INTRAVENOUS | Status: DC | PRN
Start: 2022-01-24 — End: 2022-01-24
  Administered 2022-01-24: 50 ug/kg/min via INTRAVENOUS

## 2022-01-24 MED ORDER — FENTANYL CITRATE (PF) 250 MCG/5ML IJ SOLN
INTRAMUSCULAR | Status: DC | PRN
  Administered 2022-01-24: 50 ug via INTRAVENOUS
  Administered 2022-01-24: 200 ug via INTRAVENOUS

## 2022-01-24 MED ORDER — MAGNESIUM SULFATE 2 GM/50ML IV SOLN: 2.0000 g | Freq: Every day | INTRAVENOUS | Status: DC | PRN

## 2022-01-24 MED ORDER — FENTANYL CITRATE (PF) 100 MCG/2ML IJ SOLN
INTRAMUSCULAR | Status: AC
  Filled 2022-01-24: qty 2

## 2022-01-24 MED ORDER — ROCURONIUM BROMIDE 10 MG/ML (PF) SYRINGE
PREFILLED_SYRINGE | INTRAVENOUS | Status: DC | PRN
  Administered 2022-01-24: 60 mg via INTRAVENOUS

## 2022-01-24 MED ORDER — FENTANYL CITRATE (PF) 100 MCG/2ML IJ SOLN
25.0000 ug | INTRAMUSCULAR | Status: DC | PRN
  Administered 2022-01-24: 50 ug via INTRAVENOUS

## 2022-01-24 MED ORDER — METOPROLOL TARTRATE 5 MG/5ML IV SOLN: 2.0000 mg | INTRAVENOUS | Status: DC | PRN

## 2022-01-24 MED ORDER — HEPARIN SODIUM (PORCINE) 5000 UNIT/ML IJ SOLN: 5000.0000 [IU] | Freq: Three times a day (TID) | INTRAMUSCULAR | Status: DC

## 2022-01-24 MED ORDER — SODIUM CHLORIDE 0.9 % IV SOLN: 500.0000 mL | Freq: Once | INTRAVENOUS | Status: DC | PRN

## 2022-01-24 MED ORDER — PROTAMINE SULFATE 10 MG/ML IV SOLN
INTRAVENOUS | Status: DC | PRN
Start: 2022-01-24 — End: 2022-01-24
  Administered 2022-01-24: 50 mg via INTRAVENOUS

## 2022-01-24 MED ORDER — CHLORHEXIDINE GLUCONATE 0.12 % MT SOLN: 15.0000 mL | Freq: Once | OROMUCOSAL | Status: AC

## 2022-01-24 MED ORDER — HEPARIN SODIUM (PORCINE) 5000 UNIT/ML IJ SOLN
5000.0000 [IU] | Freq: Three times a day (TID) | INTRAMUSCULAR | Status: DC
  Administered 2022-01-24 – 2022-01-25 (×2): 5000 [IU] via SUBCUTANEOUS
  Filled 2022-01-24 (×2): qty 1

## 2022-01-24 MED ORDER — DOCUSATE SODIUM 100 MG PO CAPS
100.0000 mg | ORAL_CAPSULE | Freq: Every day | ORAL | Status: DC
  Administered 2022-01-25: 100 mg via ORAL
  Filled 2022-01-24: qty 1

## 2022-01-24 MED ORDER — MIDAZOLAM HCL 2 MG/2ML IJ SOLN
INTRAMUSCULAR | Status: AC
  Filled 2022-01-24: qty 2

## 2022-01-24 MED ORDER — ONDANSETRON HCL 4 MG/2ML IJ SOLN
INTRAMUSCULAR | Status: DC | PRN
Start: 2022-01-24 — End: 2022-01-24
  Administered 2022-01-24: 4 mg via INTRAVENOUS

## 2022-01-24 MED ORDER — PHENYLEPHRINE HCL-NACL 20-0.9 MG/250ML-% IV SOLN
INTRAVENOUS | Status: DC | PRN
  Administered 2022-01-24: 25 ug/min via INTRAVENOUS

## 2022-01-24 MED ORDER — DEXMEDETOMIDINE (PRECEDEX) IN NS 20 MCG/5ML (4 MCG/ML) IV SYRINGE
PREFILLED_SYRINGE | INTRAVENOUS | Status: DC | PRN
  Administered 2022-01-24: 20 ug via INTRAVENOUS

## 2022-01-24 MED ORDER — BISACODYL 5 MG PO TBEC: 5.0000 mg | DELAYED_RELEASE_TABLET | Freq: Every day | ORAL | Status: DC | PRN

## 2022-01-24 MED ORDER — CEFAZOLIN SODIUM-DEXTROSE 2-4 GM/100ML-% IV SOLN
2.0000 g | INTRAVENOUS | Status: AC
  Administered 2022-01-24: 2 g via INTRAVENOUS
  Filled 2022-01-24: qty 100

## 2022-01-24 MED ORDER — HEPARIN 6000 UNIT IRRIGATION SOLUTION
Status: DC | PRN
  Administered 2022-01-24: 1

## 2022-01-24 SURGICAL SUPPLY — 60 items
ADH SKN CLS APL DERMABOND .7 (GAUZE/BANDAGES/DRESSINGS) ×1
BAG COUNTER SPONGE SURGICOUNT (BAG) ×3 IMPLANT
BAG SPNG CNTER NS LX DISP (BAG) ×1
BLADE CLIPPER SURG (BLADE) ×3 IMPLANT
CANISTER SUCT 3000ML PPV (MISCELLANEOUS) ×3 IMPLANT
CATH BEACON 5.038 65CM KMP-01 (CATHETERS) ×3 IMPLANT
CATH OMNI FLUSH .035X70CM (CATHETERS) ×3 IMPLANT
DERMABOND ADVANCED (GAUZE/BANDAGES/DRESSINGS) ×1
DERMABOND ADVANCED .7 DNX12 (GAUZE/BANDAGES/DRESSINGS) ×2 IMPLANT
DEVICE CLOSURE PERCLS PRGLD 6F (VASCULAR PRODUCTS) IMPLANT
DEVICE TORQUE H2O (MISCELLANEOUS) ×1 IMPLANT
DRAPE ZERO GRAVITY STERILE (DRAPES) IMPLANT
DRSG TEGADERM 2-3/8X2-3/4 SM (GAUZE/BANDAGES/DRESSINGS) ×6 IMPLANT
DRYSEAL FLEXSHEATH 12FR 33CM (SHEATH) ×1
DRYSEAL FLEXSHEATH 16FR 33CM (SHEATH) ×1
ELECT CAUTERY BLADE 6.4 (BLADE) ×3 IMPLANT
ELECT REM PT RETURN 9FT ADLT (ELECTROSURGICAL) ×2
ELECTRODE REM PT RTRN 9FT ADLT (ELECTROSURGICAL) ×4 IMPLANT
EXCLUDER TNK LEG 26MX14X18 (Endovascular Graft) IMPLANT
EXCLUDER TRUNK LEG 26MX14X18 (Endovascular Graft) ×2 IMPLANT
GAUZE SPONGE 2X2 8PLY STRL LF (GAUZE/BANDAGES/DRESSINGS) ×4 IMPLANT
GLOVE SRG 8 PF TXTR STRL LF DI (GLOVE) ×2 IMPLANT
GLOVE SURG POLYISO LF SZ7.5 (GLOVE) ×3 IMPLANT
GLOVE SURG UNDER POLY LF SZ8 (GLOVE) ×2
GOWN STRL REUS W/ TWL LRG LVL3 (GOWN DISPOSABLE) ×4 IMPLANT
GOWN STRL REUS W/ TWL XL LVL3 (GOWN DISPOSABLE) ×4 IMPLANT
GOWN STRL REUS W/TWL LRG LVL3 (GOWN DISPOSABLE) ×4
GOWN STRL REUS W/TWL XL LVL3 (GOWN DISPOSABLE) ×4
GRAFT BALLN CATH 65CM (STENTS) ×2 IMPLANT
GUIDEWIRE ANGLED .035X150CM (WIRE) ×1 IMPLANT
KIT BASIN OR (CUSTOM PROCEDURE TRAY) ×3 IMPLANT
KIT DRAIN CSF ACCUDRAIN (MISCELLANEOUS) IMPLANT
KIT TURNOVER KIT B (KITS) ×3 IMPLANT
LEG CONTRALATERAL 16X16X13.5 (Endovascular Graft) ×2 IMPLANT
NS IRRIG 1000ML POUR BTL (IV SOLUTION) ×3 IMPLANT
PACK ENDOVASCULAR (PACKS) ×3 IMPLANT
PAD ARMBOARD 7.5X6 YLW CONV (MISCELLANEOUS) ×6 IMPLANT
PENCIL BUTTON HOLSTER BLD 10FT (ELECTRODE) ×3 IMPLANT
PERCLOSE PROGLIDE 6F (VASCULAR PRODUCTS) ×8
SET MICROPUNCTURE 5F STIFF (MISCELLANEOUS) ×3 IMPLANT
SHEATH BRITE TIP 8FR 23CM (SHEATH) ×3 IMPLANT
SHEATH DRYSEAL FLEX 12FR 33CM (SHEATH) IMPLANT
SHEATH DRYSEAL FLEX 16FR 33CM (SHEATH) IMPLANT
SHEATH PINNACLE 8F 10CM (SHEATH) ×3 IMPLANT
SPONGE GAUZE 2X2 STER 10/PKG (GAUZE/BANDAGES/DRESSINGS) ×2
STENT GRAFT BALLN CATH 65CM (STENTS) ×2
STENT GRAFT CONTRALAT 16X13.5 (Endovascular Graft) IMPLANT
STOPCOCK MORSE 400PSI 3WAY (MISCELLANEOUS) ×3 IMPLANT
SUT PROLENE 5 0 C 1 24 (SUTURE) IMPLANT
SUT VIC AB 2-0 CT1 27 (SUTURE)
SUT VIC AB 2-0 CT1 TAPERPNT 27 (SUTURE) IMPLANT
SUT VIC AB 3-0 SH 27 (SUTURE)
SUT VIC AB 3-0 SH 27X BRD (SUTURE) IMPLANT
SUT VICRYL 4-0 PS2 18IN ABS (SUTURE) IMPLANT
SYR 20ML LL LF (SYRINGE) ×3 IMPLANT
TOWEL GREEN STERILE (TOWEL DISPOSABLE) ×3 IMPLANT
TRAY FOLEY MTR SLVR 16FR STAT (SET/KITS/TRAYS/PACK) ×3 IMPLANT
TUBING HIGH PRESSURE 120CM (CONNECTOR) ×3 IMPLANT
WIRE AMPLATZ SS-J .035X180CM (WIRE) ×6 IMPLANT
WIRE BENTSON .035X145CM (WIRE) ×6 IMPLANT

## 2022-01-24 NOTE — Anesthesia Procedure Notes (Addendum)
Arterial Line Insertion Start/End2/12/2021 8:00 AM, 01/24/2022 8:10 AM Performed by: Albertha Ghee, MD, Minerva Ends, CRNA, anesthesiologist  Patient location: Pre-op. Preanesthetic checklist: patient identified, IV checked, site marked, risks and benefits discussed, surgical consent, monitors and equipment checked, pre-op evaluation, timeout performed and anesthesia consent Lidocaine 1% used for infiltration Left, radial was placed Catheter size: 20 G Hand hygiene performed  and maximum sterile barriers used   Attempts: 1 Procedure performed using ultrasound guided technique. Ultrasound Notes:anatomy identified, needle tip was noted to be adjacent to the nerve/plexus identified and no ultrasound evidence of intravascular and/or intraneural injection Following insertion, dressing applied and Biopatch. Post procedure assessment: normal and unchanged  Patient tolerated the procedure well with no immediate complications. Additional procedure comments: Attempted x 3 on R side, unable to thread catheter despite confirmation of being in the vessel each time.
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
## 2022-01-24 NOTE — Anesthesia Procedure Notes (Signed)
Procedure Name: Intubation Date/Time: 01/24/2022 9:00 AM Performed by: Minerva Ends, CRNA Pre-anesthesia Checklist: Patient identified, Emergency Drugs available, Suction available and Patient being monitored Patient Re-evaluated:Patient Re-evaluated prior to induction Oxygen Delivery Method: Circle system utilized Preoxygenation: Pre-oxygenation with 100% oxygen Induction Type: IV induction Ventilation: Mask ventilation with difficulty and Oral airway inserted - appropriate to patient size Laryngoscope Size: Mac and 3 Grade View: Grade I Tube type: Oral Tube size: 7.0 mm Number of attempts: 1 Airway Equipment and Method: Stylet and Oral airway Placement Confirmation: ETT inserted through vocal cords under direct vision, positive ETCO2 and breath sounds checked- equal and bilateral Secured at: 23 cm Tube secured with: Tape Dental Injury: Teeth and Oropharynx as per pre-operative assessment

## 2022-01-24 NOTE — Transfer of Care (Addendum)
Immediate Anesthesia Transfer of Care Note  Patient: Anthony Summers  Procedure(s) Performed: ENDOVASCULAR AORTIC STENT GRAFT REPAIR (Bilateral: Groin) ULTRASOUND GUIDANCE FOR VASCULAR ACCESS (Bilateral: Groin)  Patient Location: PACU  Anesthesia Type:General  Level of Consciousness: awake, alert  and oriented  Airway & Oxygen Therapy: Patient Spontanous Breathing  Post-op Assessment: Report given to RN and Post -op Vital signs reviewed and stable  Post vital signs: Reviewed and stable  Last Vitals:  Vitals Value Taken Time  BP 143/86 01/24/22 1021  Temp    Pulse 75 01/24/22 1022  Resp 12 01/24/22 1022  SpO2 95 % 01/24/22 1022  Vitals shown include unvalidated device data.  Last Pain:  Vitals:   01/24/22 0706  PainSc: 0-No pain         Complications: No notable events documented.

## 2022-01-24 NOTE — Progress Notes (Signed)
°  Day of Surgery Note    Subjective:  says IV in the right hand is bothersome.  Feels better and has voided since catheter has been removed.    Vitals:   01/24/22 1206 01/24/22 1232  BP: (!) 155/88 (!) 152/88  Pulse: 85 87  Resp: 16 15  Temp: 97.8 F (36.6 C) 98.2 F (36.8 C)  SpO2: 99% 96%    Incisions:   bilateral groins are soft without hematoma Extremities:  bilateral feet are warm and well perfused Lungs:  non labored Abdomen:  soft NT/ND   Assessment/Plan:  This is a 66 y.o. male who is s/p  EVAR  -pt doing well this afternoon.  Foley was removed earlier and he has voided.  -anticipate discharge tomorrow if no issues overnight -he will f/u in 4 weeks with CTA protocol.  Message has been sent to office to arrange.    Leontine Locket, PA-C 01/24/2022 2:05 PM [phone]

## 2022-01-24 NOTE — Anesthesia Preprocedure Evaluation (Signed)
Anesthesia Evaluation  Patient identified by MRN, date of birth, ID band Patient awake    Reviewed: Allergy & Precautions, H&P , NPO status , Patient's Chart, lab work & pertinent test results  Airway Mallampati: II   Neck ROM: full    Dental   Pulmonary COPD, Current Smoker,    breath sounds clear to auscultation       Cardiovascular hypertension, + Peripheral Vascular Disease   Rhythm:regular Rate:Normal  Infrarenal AAA   Neuro/Psych PSYCHIATRIC DISORDERS Anxiety Depression    GI/Hepatic   Endo/Other    Renal/GU      Musculoskeletal   Abdominal   Peds  Hematology   Anesthesia Other Findings   Reproductive/Obstetrics                             Anesthesia Physical Anesthesia Plan  ASA: 3  Anesthesia Plan: General   Post-op Pain Management:    Induction: Intravenous  PONV Risk Score and Plan: 1 and Ondansetron, Dexamethasone, Midazolam and Treatment may vary due to age or medical condition  Airway Management Planned: Oral ETT  Additional Equipment: Arterial line  Intra-op Plan:   Post-operative Plan: Extubation in OR  Informed Consent: I have reviewed the patients History and Physical, chart, labs and discussed the procedure including the risks, benefits and alternatives for the proposed anesthesia with the patient or authorized representative who has indicated his/her understanding and acceptance.     Dental advisory given  Plan Discussed with: CRNA, Anesthesiologist and Surgeon  Anesthesia Plan Comments:         Anesthesia Quick Evaluation

## 2022-01-24 NOTE — Progress Notes (Signed)
Patient arrived to 4E from PACU. VSS. Telemetry box applied, CCMD notified. Bilateral groin site dressings clean, dry and intact. Patient states pain 0/10 on pain scale. Patient oriented to room and staff. Call bell in reach.  Daymon Larsen, RN

## 2022-01-24 NOTE — Interval H&P Note (Signed)
History and Physical Interval Note:  01/24/2022 8:27 AM  Anthony Summers  has presented today for surgery, with the diagnosis of Infrarenal abdominal aortic aneurysm without rupture.  The various methods of treatment have been discussed with the patient and family. After consideration of risks, benefits and other options for treatment, the patient has consented to  Procedure(s): ENDOVASCULAR AORTIC STENT GRAFT REPAIR (N/A) as a surgical intervention.  The patient's history has been reviewed, patient examined, no change in status, stable for surgery.  I have reviewed the patient's chart and labs.  Questions were answered to the patient's satisfaction.     Annamarie Major

## 2022-01-24 NOTE — Discharge Instructions (Signed)
  [facility] [place]   Discharge Instructions  Endovascular Aortic Aneurysm Repair  Please refer to the following instructions for your post-procedure care. Your surgeon or Physician Assistant will discuss any changes with you.  Activity  You are encouraged to walk as much as you can. You can slowly return to normal activities but must avoid strenuous activity and heavy lifting until your doctor tells you it's OK. Avoid activities such as vacuuming or swinging a gold club. It is normal to feel tired for several weeks after your surgery. Do not drive until your doctor gives the OK and you are no longer taking prescription pain medications. It is also normal to have difficulty with sleep habits, eating, and bowel movements after surgery. These will go away with time.  Bathing/Showering  Shower daily after you go home.  Do not soak in a bathtub, hot tub, or swim until the incision heals completely.  If you have incisions in your groin, wash the groin wounds with soap and water daily and pat dry. (No tub bath-only shower)  Then put a dry gauze or washcloth there to keep this area dry to help prevent wound infection daily and as needed.  Do not use Vaseline or neosporin on your incisions.  Only use soap and water on your incisions and then protect and keep dry.  Incision Care  Shower every day. Clean your incision with mild soap and water. Pat the area dry with a clean towel. You do not need a bandage unless otherwise instructed. Do not apply any ointments or creams to your incision. If you clothing is irritating, you may cover your incision with a dry gauze pad.  Diet  Resume your normal diet. There are no special food restrictions following this procedure. A low fat/low cholesterol diet is recommended for all patients with vascular disease. In order to heal from your surgery, it is CRITICAL to get adequate nutrition. Your body requires vitamins, minerals, and protein.  Vegetables are the best source of vitamins and minerals. Vegetables also provide the perfect balance of protein. Processed food has little nutritional value, so try to avoid this.  Medications  Resume taking all of your medications unless your doctor or nurse practitioner tells you not to. If your incision is causing pain, you may take over-the-counter pain relievers such as acetaminophen (Tylenol). If you were prescribed a stronger pain medication, please be aware these medications can cause nausea and constipation. Prevent nausea by taking the medication with a snack or meal. Avoid constipation by drinking plenty of fluids and eating foods with a high amount of fiber, such as fruits, vegetables, and grains.  Do not take Tylenol if you are taking prescription pain medications.   Follow up  Our office will schedule a follow-up appointment with a CT scan 3-4 weeks after your surgery.  Please call us immediately for any of the following conditions  Severe or worsening pain in your legs or feet or in your abdomen back or chest. Increased pain, redness, drainage (pus) from your incision site. Increased abdominal pain, bloating, nausea, vomiting or persistent diarrhea. Fever of 101 degrees or higher. Swelling in your leg (s),  Reduce your risk of vascular disease  Stop smoking. If you would like help call [facility] at 1-800-QUIT-NOW (1-800-784-8669) or [place] at [phone]. Manage your cholesterol Maintain a desired weight Control your diabetes Keep your blood pressure down  If you have questions, please call the office at [phone].  

## 2022-01-24 NOTE — Op Note (Signed)
Patient name: Anthony Summers MRN: [id number] DOB: 1956/04/10 Sex: male  01/24/2022 Pre-operative Diagnosis: AAA Post-operative diagnosis:  Same Surgeon:  Annamarie Major Assistants:  Laurence Slate, PA, Sabino Dick, MS III Procedure:   #1: Endovascular repair of infrarenal abdominal aortic aneurysm   #2: Bilateral ultrasound-guided percutaneous common femoral artery access   #3: Abdominal aortogram   #4: Catheter in aorta x2 Anesthesia:  General Blood Loss:  Minimal  Specimens:  None  Findings:  Complete exclusion  Devices Used: Main body was primary left Gore 26 x 14 x 18.  Contralateral right was a 16 x 13.5  Indications: This is a 66 year old gentleman with an expanding infrarenal abdominal aortic aneurysm who comes in today for repair.  Procedure:  The patient was identified in the holding area and taken to [place] 16  The patient was then placed supine on the table. general anesthesia was administered.  The patient was prepped and draped in the usual sterile fashion.  A time out was called and antibiotics were administered.  A PA was necessary to explant procedure and assist with technical details.  Ultrasound was used to evaluate bilateral common femoral arteries which were widely patent with posterior plaque.  A #11 blade was used to make a skin nick bilaterally.  Bilateral common femoral arteries were then cannulated under ultrasound guidance with a micropuncture needle.  A 018 wire was advanced without resistance followed by placement of a micropuncture sheath.  Next, Bentson wires were placed bilaterally, and the subcutaneous tract was dilated with an 8 [place] dilator.  Pro-glide devices were deployed at the 11:00 and 1 o'clock position for preclosure, and 8 [place] sheaths were placed.  The patient was fully heparinized.  A Amplatz wire was placed up the right followed by a 16 French sheath.  A pigtail catheter was advanced up the right side and positioned at L1.  An  abdominal aortogram was performed locating the renal arteries.  The main body device was prepared on the back table and inserted.  This was a Gore 26 x 14 x 18 device.  The device was then deployed landing at the level of the renal arteries which was confirmed by a second arteriogram.  Next, the contralateral gate was cannulated.  A pigtail catheter was able to be freely rotated within the main body device.  An Amplatz Super Stiff wire was then placed.  A 12 French sheath was then advanced up the right side.  The image detector was rotated to a left anterior oblique position and a retrograde injection was performed through the sheath in the right groin locating the right hypogastric artery.  The right limb was then inserted.  This was a Gore 16 x 13.5 device.  It was deployed landing above the hypogastric artery on the right.  Next the image detected was rotated to a right anterior oblique position and a retrograde injection was performed through the sheath in the left groin locating the left hypogastric artery.  The remaining portion of the ipsilateral limb was deployed landing just above the left hypogastric artery.  Next, , a MOB balloon was used to mold the proximal and distal attachment sites as well as device overlap.  A completion arteriogram was then performed which showed successful exclusion of the aneurysm.  Bentson wires were then inserted.  The sheaths were removed and the cannulation sites were closed by securing the previously placed ProGlide devices.  Heparin was reversed with 50 mg of protamine.  Once  hemostasis was satisfactory, subcutaneous tissue was cauterized with the Bovie.  Dermabond was placed.  The patient had brisk pedal Doppler signals.  He was successfully extubated taken recovery room in stable condition.  There were no immediate complications.   Disposition: To PACU stable.   Theotis Burrow, M.D., [employer] [facility] [place] Office: [phone] Pager:   [phone]

## 2022-01-25 ENCOUNTER — Encounter (HOSPITAL_COMMUNITY): Payer: Self-pay | Admitting: Surgery

## 2022-01-25 LAB — CBC
HCT: 39.2 % (ref 39.0–52.0)
Hemoglobin: 13.6 g/dL (ref 13.0–17.0)
MCH: 35.1 pg — ABNORMAL HIGH (ref 26.0–34.0)
MCHC: 34.7 g/dL (ref 30.0–36.0)
MCV: 101 fL — ABNORMAL HIGH (ref 80.0–100.0)
Platelets: 137 10*3/uL — ABNORMAL LOW (ref 150–400)
RBC: 3.88 MIL/uL — ABNORMAL LOW (ref 4.22–5.81)
RDW: 13.4 % (ref 11.5–15.5)
WBC: 12.3 10*3/uL — ABNORMAL HIGH (ref 4.0–10.5)
nRBC: 0 % (ref 0.0–0.2)

## 2022-01-25 LAB — BASIC METABOLIC PANEL
Anion gap: 8 (ref 5–15)
BUN: 15 mg/dL (ref 8–23)
CO2: 26 mmol/L (ref 22–32)
Calcium: 8.6 mg/dL — ABNORMAL LOW (ref 8.9–10.3)
Chloride: 101 mmol/L (ref 98–111)
Creatinine, Ser: 0.81 mg/dL (ref 0.61–1.24)
GFR, Estimated: >60 mL/min (ref >60)
Glucose, Bld: 129 mg/dL — ABNORMAL HIGH (ref 70–99)
Potassium: 3.8 mmol/L (ref 3.5–5.1)
Sodium: 135 mmol/L (ref 135–145)

## 2022-01-25 MED ORDER — ROSUVASTATIN CALCIUM 10 MG PO TABS: 10.0000 mg | ORAL_TABLET | Freq: Every day | ORAL | 11 refills | Status: DC

## 2022-01-25 MED ORDER — HYDROCODONE-ACETAMINOPHEN 5-325 MG PO TABS: 1.0000 | ORAL_TABLET | Freq: Four times a day (QID) | ORAL | 0 refills | Status: DC | PRN

## 2022-01-25 NOTE — Progress Notes (Addendum)
Progress Note    01/25/2022 7:05 AM 1 Day Post-Op  Subjective:  ready to go home.  Doesn't have any pain  Afebrile HR 70's-100's NSR 436'G-677'C systolic afebrile  Vitals:   01/24/22 2348 01/25/22 0400  BP: 140/74 134/72  Pulse: 79 76  Resp: 16 16  Temp: 98.7 F (37.1 C) 98.5 F (36.9 C)  SpO2: 96%     Physical Exam: Cardiac:  regular Lungs:  non labored Incisions:  bilateral groins are soft without hematoma Extremities:  palpable pedal pulses Abdomen:  soft, NT/ND3  CBC    Component Value Date/Time   WBC 12.3 (H) 01/25/2022 0436   RBC 3.88 (L) 01/25/2022 0436   HGB 13.6 01/25/2022 0436   HCT 39.2 01/25/2022 0436   PLT 137 (L) 01/25/2022 0436   MCV 101.0 (H) 01/25/2022 0436   MCH 35.1 (H) 01/25/2022 0436   MCHC 34.7 01/25/2022 0436   RDW 13.4 01/25/2022 0436   LYMPHSABS 1.9 05/05/2009 1252   MONOABS 0.5 05/05/2009 1252   EOSABS 0.3 05/05/2009 1252   BASOSABS 0.0 05/05/2009 1252    BMET    Component Value Date/Time   NA 135 01/25/2022 0436   K 3.8 01/25/2022 0436   CL 101 01/25/2022 0436   CO2 26 01/25/2022 0436   GLUCOSE 129 (H) 01/25/2022 0436   BUN 15 01/25/2022 0436   CREATININE 0.81 01/25/2022 0436   CALCIUM 8.6 (L) 01/25/2022 0436   GFRNONAA >60 01/25/2022 0436   GFRAA  05/05/2009 1252    >60        The eGFR has been calculated using the MDRD equation. This calculation has not been validated in all clinical situations. eGFR's persistently <60 mL/min signify possible Chronic Kidney Disease.    INR    Component Value Date/Time   INR 1.0 01/24/2022 1100     Intake/Output Summary (Last 24 hours) at 01/25/2022 0705 Last data filed at 01/25/2022 0540 Gross per 24 hour  Intake 3069.82 ml  Output 2950 ml  Net 119.82 ml     Assessment/Plan:  66 y.o. male is s/p:  EVAR  1 Day Post-Op   -pt with palpable pedal pulses; he has ambulated and voided. -renal function stable -pt not on statin PTA, discussed with Dr. Trula Slade and will  start statin on discharge.  -discharge home today -f/u in 4 weeks with CTA    Leontine Locket, PA-C [facility] [phone] 01/25/2022 7:05 AM  Agree with the above.  No acute issues.  Plan for d/c home   WElls Kenady Doxtater

## 2022-01-25 NOTE — Anesthesia Postprocedure Evaluation (Signed)
Anesthesia Post Note  Patient: LEXX MONTE  Procedure(s) Performed: ENDOVASCULAR AORTIC STENT GRAFT REPAIR (Bilateral: Groin) ULTRASOUND GUIDANCE FOR VASCULAR ACCESS (Bilateral: Groin)     Patient location during evaluation: PACU Anesthesia Type: General Level of consciousness: awake and alert Pain management: pain level controlled Vital Signs Assessment: post-procedure vital signs reviewed and stable Respiratory status: spontaneous breathing, nonlabored ventilation, respiratory function stable and patient connected to nasal cannula oxygen Cardiovascular status: blood pressure returned to baseline and stable Postop Assessment: no apparent nausea or vomiting Anesthetic complications: no   No notable events documented.  Last Vitals:  Vitals:   01/24/22 2348 01/25/22 0400  BP: 140/74 134/72  Pulse: 79 76  Resp: 16 16  Temp: 37.1 C 36.9 C  SpO2: 96%     Last Pain:  Vitals:   01/25/22 0400  TempSrc: Oral  PainSc: 0-No pain                 Norlan Rann S

## 2022-01-26 ENCOUNTER — Telehealth: Payer: Self-pay | Admitting: *Deleted

## 2022-01-26 NOTE — Telephone Encounter (Signed)
Patient called stating he was experiencing "dull, achy" lower back discomfort.  He states he has chronic "golfers back" and attributes this to lying on his back too much.  I discussed with Laurence Slate PA.  She suggests patient to walk and to call back if symptoms worsen. I instructed patient and he voiced understanding.

## 2022-02-05 ENCOUNTER — Encounter: Payer: Self-pay | Admitting: Surgery

## 2022-02-09 ENCOUNTER — Other Ambulatory Visit: Payer: Self-pay

## 2022-02-09 DIAGNOSIS — I7143 Infrarenal abdominal aortic aneurysm, without rupture: Secondary | ICD-10-CM

## 2022-02-15 ENCOUNTER — Other Ambulatory Visit: Payer: Medicare Other

## 2022-02-20 ENCOUNTER — Other Ambulatory Visit: Payer: Self-pay

## 2022-02-20 ENCOUNTER — Other Ambulatory Visit: Payer: Medicare Other

## 2022-02-20 ENCOUNTER — Other Ambulatory Visit: Payer: Self-pay | Admitting: Surgery

## 2022-02-20 ENCOUNTER — Ambulatory Visit: Admission: RE | Admit: 2022-02-20 | Payer: Medicare Other | Source: Ambulatory Visit

## 2022-02-20 DIAGNOSIS — I7143 Infrarenal abdominal aortic aneurysm, without rupture: Secondary | ICD-10-CM

## 2022-02-26 ENCOUNTER — Encounter: Payer: Medicare Other | Admitting: Surgery

## 2022-03-14 ENCOUNTER — Ambulatory Visit
Admission: RE | Admit: 2022-03-14 | Discharge: 2022-03-14 | Disposition: A | Discharge status: Home / Self Care | Payer: Medicare Other | Source: Ambulatory Visit | Attending: Surgery | Admitting: Surgery

## 2022-03-14 DIAGNOSIS — I7143 Infrarenal abdominal aortic aneurysm, without rupture: Secondary | ICD-10-CM

## 2022-03-14 MED ORDER — IOPAMIDOL (ISOVUE-370) INJECTION 76%
75.0000 mL | Freq: Once | INTRAVENOUS | Status: AC | PRN
  Administered 2022-03-14: 75 mL via INTRAVENOUS

## 2022-03-19 ENCOUNTER — Ambulatory Visit (INDEPENDENT_AMBULATORY_CARE_PROVIDER_SITE_OTHER): Payer: Medicare Other | Admitting: Surgery

## 2022-03-19 ENCOUNTER — Other Ambulatory Visit: Payer: Self-pay

## 2022-03-19 ENCOUNTER — Encounter: Payer: Self-pay | Admitting: Surgery

## 2022-03-19 VITALS — BP 154/83 | HR 87 | Resp 20 | Ht 73.0 in | Wt 209.0 lb

## 2022-03-19 DIAGNOSIS — I7143 Infrarenal abdominal aortic aneurysm, without rupture: Secondary | ICD-10-CM
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
## 2022-03-20 ENCOUNTER — Other Ambulatory Visit: Payer: Self-pay | Admitting: *Deleted

## 2022-03-20 DIAGNOSIS — M79606 Pain in leg, unspecified: Secondary | ICD-10-CM

## 2022-03-20 DIAGNOSIS — I7143 Infrarenal abdominal aortic aneurysm, without rupture: Secondary | ICD-10-CM

## 2022-03-20 DIAGNOSIS — I739 Peripheral vascular disease, unspecified: Secondary | ICD-10-CM

## 2022-05-24 DIAGNOSIS — C61 Malignant neoplasm of prostate: Secondary | ICD-10-CM

## 2022-05-24 HISTORY — DX: Malignant neoplasm of prostate: C61

## 2022-06-13 ENCOUNTER — Other Ambulatory Visit (HOSPITAL_COMMUNITY): Payer: Self-pay | Admitting: Urology

## 2022-06-13 DIAGNOSIS — C61 Malignant neoplasm of prostate: Secondary | ICD-10-CM

## 2022-06-19 ENCOUNTER — Ambulatory Visit (HOSPITAL_COMMUNITY)
Admission: RE | Admit: 2022-06-19 | Discharge: 2022-06-19 | Disposition: A | Discharge status: Home / Self Care | Payer: Medicare Other | Source: Ambulatory Visit | Attending: Urology | Admitting: Urology

## 2022-06-19 DIAGNOSIS — C61 Malignant neoplasm of prostate: Secondary | ICD-10-CM | POA: Insufficient documentation

## 2022-06-19 MED ORDER — PIFLIFOLASTAT F 18 (PYLARIFY) INJECTION
9.0000 | Freq: Once | INTRAVENOUS | Status: AC
  Administered 2022-06-19: 9.5 via INTRAVENOUS

## 2022-06-27 ENCOUNTER — Telehealth: Payer: Self-pay | Admitting: Radiation Oncology

## 2022-06-27 NOTE — Telephone Encounter (Signed)
LVM to sched CON with Dr. Manning 

## 2022-07-04 NOTE — Progress Notes (Signed)
GU Location of Tumor / Histology: Prostate Ca  If Prostate Cancer, Gleason Score is (4 + 3) and PSA is (9.95 as of 04/2022)  Biopsies      Past/Anticipated interventions by urology, if any: NA  Past/Anticipated interventions by medical oncology, if any: NA  Weight changes, if any:  No  IPSS:  9 SHIM:  6  Bowel/Bladder complaints, if any:  No  Nausea/Vomiting, if any: No  Pain issues, if any:  0/10  SAFETY ISSUES: Prior radiation? No Pacemaker/ICD? No Possible current pregnancy? Male Is the patient on methotrexate? No  Current Complaints / other details:
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
## 2022-07-09 ENCOUNTER — Ambulatory Visit
Admission: RE | Admit: 2022-07-09 | Discharge: 2022-07-09 | Disposition: A | Discharge status: Home / Self Care | Payer: Medicare Other | Source: Ambulatory Visit | Attending: Radiation Oncology | Admitting: Radiation Oncology

## 2022-07-09 ENCOUNTER — Other Ambulatory Visit: Payer: Self-pay

## 2022-07-09 VITALS — BP 136/75 | HR 81 | Temp 97.9°F | Resp 17 | Wt 200.5 lb

## 2022-07-09 DIAGNOSIS — K8020 Calculus of gallbladder without cholecystitis without obstruction: Secondary | ICD-10-CM | POA: Insufficient documentation

## 2022-07-09 DIAGNOSIS — E785 Hyperlipidemia, unspecified: Secondary | ICD-10-CM | POA: Insufficient documentation

## 2022-07-09 DIAGNOSIS — I700 Atherosclerosis of aorta: Secondary | ICD-10-CM | POA: Diagnosis not present

## 2022-07-09 DIAGNOSIS — Z79899 Other long term (current) drug therapy: Secondary | ICD-10-CM | POA: Insufficient documentation

## 2022-07-09 DIAGNOSIS — C61 Malignant neoplasm of prostate: Secondary | ICD-10-CM | POA: Insufficient documentation

## 2022-07-09 DIAGNOSIS — F17210 Nicotine dependence, cigarettes, uncomplicated: Secondary | ICD-10-CM | POA: Insufficient documentation

## 2022-07-09 DIAGNOSIS — K760 Fatty (change of) liver, not elsewhere classified: Secondary | ICD-10-CM | POA: Diagnosis not present

## 2022-07-09 DIAGNOSIS — K5730 Diverticulosis of large intestine without perforation or abscess without bleeding: Secondary | ICD-10-CM | POA: Diagnosis not present

## 2022-07-09 DIAGNOSIS — I10 Essential (primary) hypertension: Secondary | ICD-10-CM | POA: Diagnosis not present

## 2022-07-10 NOTE — Progress Notes (Signed)
RN left voicemail for call back to assess navigation needs after consult with Dr. Tammi Klippel on 7/17.

## 2022-07-12 NOTE — Progress Notes (Signed)
Spoke with patient and introduced myself to the patient as the prostate nurse navigator.  No barriers to care identified at this time.  He had a RadOnc consult with Dr. Tammi Klippel on 7/17.  He has confirmed he would like to proceed with brachytherapy as his treatment.  I educated him on next steps and what to expect.  I gave him my direct number and he is aware we will be in contact with upcoming appointment dates for CT Sim and brachytherapy.

## 2022-07-17 ENCOUNTER — Telehealth: Payer: Self-pay | Admitting: *Deleted

## 2022-07-17 NOTE — Telephone Encounter (Signed)
CALLED PATIENT TO ASK QUESTIONS, LVM FOR A RETURN CALL 

## 2022-07-23 ENCOUNTER — Telehealth: Payer: Self-pay

## 2022-07-23 NOTE — Telephone Encounter (Signed)
Called pt to schedule OV, Dr. Gasper Sells request to see pt 4-5 months post procedure.  Pt reports diagnosed with Prostate cancer and is awaiting schedule for radioactive seed treatment.  Pt does not wish to schedule OV at this time.  Pt is agreeable to been placed on a 6 month recall.  No questions or concerns voiced.

## 2022-07-24 ENCOUNTER — Telehealth: Payer: Self-pay | Admitting: *Deleted

## 2022-07-24 NOTE — Telephone Encounter (Signed)
CALLED PATIENT TO ASK QUESTIONS, LVM FOR A RETURN CALL 

## 2022-07-26 NOTE — Progress Notes (Signed)
RN updated patient that we are still in process confirming surgical date for upcoming brachytherapy.  Verbalized understanding, no further needs at this time.

## 2022-08-01 ENCOUNTER — Telehealth: Payer: Self-pay | Admitting: *Deleted

## 2022-08-01 NOTE — Telephone Encounter (Signed)
CALLED PATIENT TO INFORM OF IMPLANT DATE, SPOKE WITH PATIENT AND HE IS AWARE OF THIS DATE 

## 2022-08-03 ENCOUNTER — Telehealth: Payer: Self-pay | Admitting: *Deleted

## 2022-08-03 NOTE — Telephone Encounter (Signed)
Called patient to inform of pre-seed appts. and implant date, spoke with patient and he is aware of these appts. 

## 2022-08-07 ENCOUNTER — Other Ambulatory Visit: Payer: Self-pay | Admitting: Urology

## 2022-08-22 ENCOUNTER — Encounter: Payer: Self-pay | Admitting: Internal Medicine

## 2022-08-22 ENCOUNTER — Ambulatory Visit: Payer: Medicare Other | Attending: Internal Medicine | Admitting: Internal Medicine

## 2022-08-22 VITALS — BP 114/64 | HR 98 | Ht 73.0 in | Wt 196.0 lb

## 2022-08-22 DIAGNOSIS — I2584 Coronary atherosclerosis due to calcified coronary lesion: Secondary | ICD-10-CM | POA: Insufficient documentation

## 2022-08-22 DIAGNOSIS — I2510 Atherosclerotic heart disease of native coronary artery without angina pectoris: Secondary | ICD-10-CM | POA: Insufficient documentation

## 2022-08-22 DIAGNOSIS — I700 Atherosclerosis of aorta: Secondary | ICD-10-CM | POA: Diagnosis present

## 2022-08-22 DIAGNOSIS — E782 Mixed hyperlipidemia: Secondary | ICD-10-CM | POA: Diagnosis present

## 2022-08-22 NOTE — Patient Instructions (Addendum)
Medication Instructions:  NO CHANGES *If you need a refill on your cardiac medications before your next appointment, please call your pharmacy*   Lab Work: [place] AS FOLLOW UP WITH DR Gasper Sells  If you have labs (blood work) drawn today and your tests are completely normal, you will receive your results only by: [place] (if you have MyChart) OR A paper copy in the mail If you have any lab test that is abnormal or we need to change your treatment, we will call you to review the results.   Testing/Procedures: How to Prepare for Your Cardiac PET/CT Stress Test:  1. Please do not take these medications before your test:   Medications that may interfere with the cardiac pharmacological stress agent (ex. nitrates - including erectile dysfunction medications or beta-blockers) the day of the exam. (Erectile dysfunction medication should be held for at least 72 hrs prior to test) Theophylline containing medications for 12 hours. Dipyridamole 48 hours prior to the test. Your remaining medications may be taken with water.  2. Nothing to eat or drink, except water, 3 hours prior to arrival time.   NO caffeine/decaffeinated products, or chocolate 12 hours prior to arrival.  3. NO perfume, cologne or lotion  4. Total time is 1 to 2 hours; you may want to bring reading material for the waiting time.  5. Please report to Admitting at the [facility] Main Entrance 60 minutes early for your test.  [place], [place] [address]  Diabetic Preparation:  Hold oral medications. You may take NPH and Lantus insulin. Do not take Humalog or Humulin R (Regular Insulin) the day of your test. Check blood sugars prior to leaving the house. If able to eat breakfast prior to 3 hour fasting, you may take all medications, including your insulin, Do not worry if you miss your breakfast dose of insulin - start at your next meal. In preparation for your  appointment, medication and supplies will be purchased.  Appointment availability is limited, so if you need to cancel or reschedule, please call the Radiology Department at [phone]  24 hours in advance to avoid a cancellation fee of $100.00  What to Expect After you Arrive:  Once you arrive and check in for your appointment, you will be taken to a preparation room within the Radiology Department.  A technologist or Nurse will obtain your medical history, verify that you are correctly prepped for the exam, and explain the procedure.  Afterwards,  an IV will be started in your arm and electrodes will be placed on your skin for EKG monitoring during the stress portion of the exam. Then you will be escorted to the PET/CT scanner.  There, staff will get you positioned on the scanner and obtain a blood pressure and EKG.  During the exam, you will continue to be connected to the EKG and blood pressure machines.  A small, safe amount of a radioactive tracer will be injected in your IV to obtain a series of pictures of your heart along with an injection of a stress agent.    After your Exam:  It is recommended that you eat a meal and drink a caffeinated beverage to counter act any effects of the stress agent.  Drink plenty of fluids for the remainder of the day and urinate frequently for the first couple of hours after the exam.  Your doctor will inform you of your test results within 7-10 business days.  For  questions about your test or how to prepare for your test, please call: Marchia Bond, Cardiac Imaging Nurse Navigator  Gordy Clement, Cardiac Imaging Nurse Navigator Office: [phone]    Follow-Up: At [facility], you and your health needs are our priority.  As part of our continuing mission to provide you with exceptional heart care, we have created designated Provider Care Teams.  These Care Teams include your primary Cardiologist (physician) and Advanced Practice Providers (APPs -   Physician Assistants and Nurse Practitioners) who all work together to provide you with the care you need, when you need it.  We recommend signing up for the patient portal called "MyChart".  Sign up information is provided on this After Visit Summary.  MyChart is used to connect with patients for Virtual Visits (Telemedicine).  Patients are able to view lab/test results, encounter notes, upcoming appointments, etc.  Non-urgent messages can be sent to your provider as well.   To learn more about what you can do with MyChart, go to [email or web address].    Your next appointment:   3 month(s)  The format for your next appointment:   In Person  Provider:   Werner Lean, MD     Other Instructions NONE  Important Information About Sugar

## 2022-08-22 NOTE — Progress Notes (Signed)
Cardiology Office Note:    Date:  08/22/2022   ID:  PERLE GIBBON, DOB 05-20-1956, MRN [id number]  PCP:  Janie Morning, DO   [facility] Providers Cardiologist:  Werner Lean, MD     Referring MD: Deon Pilling, NP   CC: Post operative f/u  History of Present Illness:    Anthony Summers is a 66 y.o. male with a hx of HTN, HLD, COPD and active tobacco use, Mild bilateral CAS, Severe AAA who presents for evaluation 01/02/22. 2023: found to have prostate cancer.  Did will with AAA repair.  Patient notes that he is doing great.   Since last visit notes that he has had PVCs for a while. Notes that he has near dizziness and weakness:  When he travels back from [place] he feels very dizzy.  Saw his GP and saw PVCs Still walking his do in [place] and does three miles a day. Really only feels dizzy after long drives. Still smokes. Doesn't smoke in the car. Sometimes will have dizziness in the house.  No chest pain or pressure .  No SOB/DOE and no PND/Orthopnea.  No weight gain or leg swelling.  No palpitations or syncope .   Past Medical History:  Diagnosis Date   Hyperlipidemia    Hypertension    Pneumonia     Past Surgical History:  Procedure Laterality Date   ABDOMINAL AORTIC ENDOVASCULAR STENT GRAFT Bilateral 01/24/2022   Procedure: ENDOVASCULAR AORTIC STENT GRAFT REPAIR;  Surgeon: Serafina Mitchell, MD;  Location: [facility] OR;  Service: Vascular;  Laterality: Bilateral;   ABDOMINAL SURGERY     SHOULDER SURGERY     TONSILLECTOMY     ULTRASOUND GUIDANCE FOR VASCULAR ACCESS Bilateral 01/24/2022   Procedure: ULTRASOUND GUIDANCE FOR VASCULAR ACCESS;  Surgeon: Serafina Mitchell, MD;  Location: [facility] OR;  Service: Vascular;  Laterality: Bilateral;    Current Medications: Current Meds  Medication Sig   albuterol (VENTOLIN HFA) 108 (90 Base) MCG/ACT inhaler Inhale 1-2 puffs into the lungs every 6 (six) hours as needed for shortness of breath or wheezing.   B Complex  Vitamins (B COMPLEX 1 PO) Take 1 tablet by mouth daily.   Cholecalciferol (VITAMIN D) 125 MCG (5000 UT) CAPS Take 5,000 Units by mouth daily.   ezetimibe (ZETIA) 10 MG tablet Take 10 mg by mouth daily.   fluticasone (FLONASE) 50 MCG/ACT nasal spray Place 1-2 sprays into both nostrils daily as needed for allergies.   furosemide (LASIX) 20 MG tablet Take 20 mg by mouth daily.   losartan (COZAAR) 50 MG tablet Take 25 mg by mouth daily.   Multiple Vitamins-Minerals (PRESERVISION AREDS 2 PO) Take 1 capsule by mouth in the morning and at bedtime.   naproxen sodium (ALEVE) 220 MG tablet Take 220 mg by mouth daily as needed (pain).   rosuvastatin (CRESTOR) 10 MG tablet Take 1 tablet (10 mg total) by mouth daily.   Current Facility-Administered Medications for the 08/22/22 encounter (Office Visit) with Werner Lean, MD  Medication   aspirin EC tablet 81 mg     Allergies:   Patient has no known allergies.   Social History   Socioeconomic History   Marital status: Married    Spouse name: Janett Billow   Number of children: 2   Years of education: Not on file   Highest education level: Not on file  Occupational History   Not on file  Tobacco Use   Smoking status: Every Day    Packs/day:  1.00    Types: Cigarettes   Smokeless tobacco: Never  Vaping Use   Vaping Use: Never used  Substance and Sexual Activity   Alcohol use: Yes    Comment: daily   Drug use: No   Sexual activity: Yes  Other Topics Concern   Not on file  Social History Narrative   Not on file   Social Determinants of Health   Financial Resource Strain: Not on file  Food Insecurity: Not on file  Transportation Needs: Not on file  Physical Activity: Not on file  Stress: Not on file  Social Connections: Not on file   Social: has a dog and a house in [place]  Family History: History of coronary artery disease notable for mother 2 PCI at age 26. History of heart failure notable for no members. History of  arrhythmia notable for no members.  ROS:   Please see the history of present illness.     All other systems reviewed and are negative.  EKGs/Labs/Other Studies Reviewed:    The following studies were reviewed today:  EKG:  EKG is  ordered today.  The ekg ordered today demonstrates  08/22/22: SR with occasional PVCs 01/02/22 01/02/22: SR 96 occasional PVCs  Lung CT: Date: 02/03/21 Results: Aortic atherosclerosis 3VD CAC  ECHO COMPLETE WO IMAGING ENHANCING AGENT 01/16/2022  Narrative ECHOCARDIOGRAM REPORT    Patient Name:   Anthony Summers Date of Exam: 01/16/2022 Medical Rec #:  [id number]         Height:       73.0 in Accession #:    [id number]        Weight:       217.0 lb Date of Birth:  1956-01-04         BSA:          2.228 m Patient Age:    29 years          BP:           136/68 mmHg Patient Gender: M                 HR:           87 bpm. Exam Location:  [facility]  Procedure: 2D Echo, 3D Echo, Color Doppler, Limited Color Doppler and Strain Analysis  Indications:    Z01.818 Pre-Operative Eval for Abdominal Aortic Aneurysm Repair  History:        Patient has no prior history of Echocardiogram examinations. Arrythmias:PVC; Risk Factors:Hypertension, Dyslipidemia and Current Smoker. Abdominal Aortic Aneurysm.  Sonographer:    Deliah Boston RDCS Referring Phys: [facility]  IMPRESSIONS   1. Left ventricular ejection fraction by 3D volume is 69 %. The left ventricle has normal function. The left ventricle has no regional wall motion abnormalities. Left ventricular diastolic parameters are consistent with Grade I diastolic dysfunction (impaired relaxation). The average left ventricular global longitudinal strain is 23.3 %. The global longitudinal strain is normal. 2. Right ventricular systolic function is normal. The right ventricular size is normal. 3. The mitral valve is normal in structure. No evidence of mitral valve regurgitation. No evidence of  mitral stenosis. 4. The aortic valve is normal in structure. Aortic valve regurgitation is not visualized. No aortic stenosis is present. 5. There is borderline dilatation of the ascending aorta, measuring 39 mm. 6. The inferior vena cava is normal in size with greater than 50% respiratory variability, suggesting right atrial pressure of 3 mmHg.  FINDINGS Left Ventricle: Left ventricular ejection  1.00    Types: Cigarettes   Smokeless tobacco: Never  Vaping Use   Vaping Use: Never used  Substance and Sexual Activity   Alcohol use: Yes    Comment: daily   Drug use: No   Sexual activity: Yes  Other Topics Concern   Not on file  Social History Narrative   Not on file   Social Determinants of Health   Financial Resource Strain: Not on file  Food Insecurity: Not on file  Transportation Needs: Not on file  Physical Activity: Not on file  Stress: Not on file  Social Connections: Not on file   Social: has a dog and a house in [place]  Family History: History of coronary artery disease notable for mother 2 PCI at age 26. History of heart failure notable for no members. History of  arrhythmia notable for no members.  ROS:   Please see the history of present illness.     All other systems reviewed and are negative.  EKGs/Labs/Other Studies Reviewed:    The following studies were reviewed today:  EKG:  EKG is  ordered today.  The ekg ordered today demonstrates  08/22/22: SR with occasional PVCs 01/02/22 01/02/22: SR 96 occasional PVCs  Lung CT: Date: 02/03/21 Results: Aortic atherosclerosis 3VD CAC  ECHO COMPLETE WO IMAGING ENHANCING AGENT 01/16/2022  Narrative ECHOCARDIOGRAM REPORT    Patient Name:   Anthony Summers Date of Exam: 01/16/2022 Medical Rec #:  [id number]         Height:       73.0 in Accession #:    [id number]        Weight:       217.0 lb Date of Birth:  1956-01-04         BSA:          2.228 m Patient Age:    29 years          BP:           136/68 mmHg Patient Gender: M                 HR:           87 bpm. Exam Location:  [facility]  Procedure: 2D Echo, 3D Echo, Color Doppler, Limited Color Doppler and Strain Analysis  Indications:    Z01.818 Pre-Operative Eval for Abdominal Aortic Aneurysm Repair  History:        Patient has no prior history of Echocardiogram examinations. Arrythmias:PVC; Risk Factors:Hypertension, Dyslipidemia and Current Smoker. Abdominal Aortic Aneurysm.  Sonographer:    Deliah Boston RDCS Referring Phys: [facility]  IMPRESSIONS   1. Left ventricular ejection fraction by 3D volume is 69 %. The left ventricle has normal function. The left ventricle has no regional wall motion abnormalities. Left ventricular diastolic parameters are consistent with Grade I diastolic dysfunction (impaired relaxation). The average left ventricular global longitudinal strain is 23.3 %. The global longitudinal strain is normal. 2. Right ventricular systolic function is normal. The right ventricular size is normal. 3. The mitral valve is normal in structure. No evidence of mitral valve regurgitation. No evidence of  mitral stenosis. 4. The aortic valve is normal in structure. Aortic valve regurgitation is not visualized. No aortic stenosis is present. 5. There is borderline dilatation of the ascending aorta, measuring 39 mm. 6. The inferior vena cava is normal in size with greater than 50% respiratory variability, suggesting right atrial pressure of 3 mmHg.  FINDINGS Left Ventricle: Left ventricular ejection  Cardiology Office Note:    Date:  08/22/2022   ID:  PERLE GIBBON, DOB 05-20-1956, MRN [id number]  PCP:  Janie Morning, DO   [facility] Providers Cardiologist:  Werner Lean, MD     Referring MD: Deon Pilling, NP   CC: Post operative f/u  History of Present Illness:    Anthony Summers is a 66 y.o. male with a hx of HTN, HLD, COPD and active tobacco use, Mild bilateral CAS, Severe AAA who presents for evaluation 01/02/22. 2023: found to have prostate cancer.  Did will with AAA repair.  Patient notes that he is doing great.   Since last visit notes that he has had PVCs for a while. Notes that he has near dizziness and weakness:  When he travels back from [place] he feels very dizzy.  Saw his GP and saw PVCs Still walking his do in [place] and does three miles a day. Really only feels dizzy after long drives. Still smokes. Doesn't smoke in the car. Sometimes will have dizziness in the house.  No chest pain or pressure .  No SOB/DOE and no PND/Orthopnea.  No weight gain or leg swelling.  No palpitations or syncope .   Past Medical History:  Diagnosis Date   Hyperlipidemia    Hypertension    Pneumonia     Past Surgical History:  Procedure Laterality Date   ABDOMINAL AORTIC ENDOVASCULAR STENT GRAFT Bilateral 01/24/2022   Procedure: ENDOVASCULAR AORTIC STENT GRAFT REPAIR;  Surgeon: Serafina Mitchell, MD;  Location: [facility] OR;  Service: Vascular;  Laterality: Bilateral;   ABDOMINAL SURGERY     SHOULDER SURGERY     TONSILLECTOMY     ULTRASOUND GUIDANCE FOR VASCULAR ACCESS Bilateral 01/24/2022   Procedure: ULTRASOUND GUIDANCE FOR VASCULAR ACCESS;  Surgeon: Serafina Mitchell, MD;  Location: [facility] OR;  Service: Vascular;  Laterality: Bilateral;    Current Medications: Current Meds  Medication Sig   albuterol (VENTOLIN HFA) 108 (90 Base) MCG/ACT inhaler Inhale 1-2 puffs into the lungs every 6 (six) hours as needed for shortness of breath or wheezing.   B Complex  Vitamins (B COMPLEX 1 PO) Take 1 tablet by mouth daily.   Cholecalciferol (VITAMIN D) 125 MCG (5000 UT) CAPS Take 5,000 Units by mouth daily.   ezetimibe (ZETIA) 10 MG tablet Take 10 mg by mouth daily.   fluticasone (FLONASE) 50 MCG/ACT nasal spray Place 1-2 sprays into both nostrils daily as needed for allergies.   furosemide (LASIX) 20 MG tablet Take 20 mg by mouth daily.   losartan (COZAAR) 50 MG tablet Take 25 mg by mouth daily.   Multiple Vitamins-Minerals (PRESERVISION AREDS 2 PO) Take 1 capsule by mouth in the morning and at bedtime.   naproxen sodium (ALEVE) 220 MG tablet Take 220 mg by mouth daily as needed (pain).   rosuvastatin (CRESTOR) 10 MG tablet Take 1 tablet (10 mg total) by mouth daily.   Current Facility-Administered Medications for the 08/22/22 encounter (Office Visit) with Werner Lean, MD  Medication   aspirin EC tablet 81 mg     Allergies:   Patient has no known allergies.   Social History   Socioeconomic History   Marital status: Married    Spouse name: Janett Billow   Number of children: 2   Years of education: Not on file   Highest education level: Not on file  Occupational History   Not on file  Tobacco Use   Smoking status: Every Day    Packs/day:  1.00    Types: Cigarettes   Smokeless tobacco: Never  Vaping Use   Vaping Use: Never used  Substance and Sexual Activity   Alcohol use: Yes    Comment: daily   Drug use: No   Sexual activity: Yes  Other Topics Concern   Not on file  Social History Narrative   Not on file   Social Determinants of Health   Financial Resource Strain: Not on file  Food Insecurity: Not on file  Transportation Needs: Not on file  Physical Activity: Not on file  Stress: Not on file  Social Connections: Not on file   Social: has a dog and a house in [place]  Family History: History of coronary artery disease notable for mother 2 PCI at age 26. History of heart failure notable for no members. History of  arrhythmia notable for no members.  ROS:   Please see the history of present illness.     All other systems reviewed and are negative.  EKGs/Labs/Other Studies Reviewed:    The following studies were reviewed today:  EKG:  EKG is  ordered today.  The ekg ordered today demonstrates  08/22/22: SR with occasional PVCs 01/02/22 01/02/22: SR 96 occasional PVCs  Lung CT: Date: 02/03/21 Results: Aortic atherosclerosis 3VD CAC  ECHO COMPLETE WO IMAGING ENHANCING AGENT 01/16/2022  Narrative ECHOCARDIOGRAM REPORT    Patient Name:   Anthony Summers Date of Exam: 01/16/2022 Medical Rec #:  [id number]         Height:       73.0 in Accession #:    [id number]        Weight:       217.0 lb Date of Birth:  1956-01-04         BSA:          2.228 m Patient Age:    29 years          BP:           136/68 mmHg Patient Gender: M                 HR:           87 bpm. Exam Location:  [facility]  Procedure: 2D Echo, 3D Echo, Color Doppler, Limited Color Doppler and Strain Analysis  Indications:    Z01.818 Pre-Operative Eval for Abdominal Aortic Aneurysm Repair  History:        Patient has no prior history of Echocardiogram examinations. Arrythmias:PVC; Risk Factors:Hypertension, Dyslipidemia and Current Smoker. Abdominal Aortic Aneurysm.  Sonographer:    Deliah Boston RDCS Referring Phys: [facility]  IMPRESSIONS   1. Left ventricular ejection fraction by 3D volume is 69 %. The left ventricle has normal function. The left ventricle has no regional wall motion abnormalities. Left ventricular diastolic parameters are consistent with Grade I diastolic dysfunction (impaired relaxation). The average left ventricular global longitudinal strain is 23.3 %. The global longitudinal strain is normal. 2. Right ventricular systolic function is normal. The right ventricular size is normal. 3. The mitral valve is normal in structure. No evidence of mitral valve regurgitation. No evidence of  mitral stenosis. 4. The aortic valve is normal in structure. Aortic valve regurgitation is not visualized. No aortic stenosis is present. 5. There is borderline dilatation of the ascending aorta, measuring 39 mm. 6. The inferior vena cava is normal in size with greater than 50% respiratory variability, suggesting right atrial pressure of 3 mmHg.  FINDINGS Left Ventricle: Left ventricular ejection  1.00    Types: Cigarettes   Smokeless tobacco: Never  Vaping Use   Vaping Use: Never used  Substance and Sexual Activity   Alcohol use: Yes    Comment: daily   Drug use: No   Sexual activity: Yes  Other Topics Concern   Not on file  Social History Narrative   Not on file   Social Determinants of Health   Financial Resource Strain: Not on file  Food Insecurity: Not on file  Transportation Needs: Not on file  Physical Activity: Not on file  Stress: Not on file  Social Connections: Not on file   Social: has a dog and a house in [place]  Family History: History of coronary artery disease notable for mother 2 PCI at age 26. History of heart failure notable for no members. History of  arrhythmia notable for no members.  ROS:   Please see the history of present illness.     All other systems reviewed and are negative.  EKGs/Labs/Other Studies Reviewed:    The following studies were reviewed today:  EKG:  EKG is  ordered today.  The ekg ordered today demonstrates  08/22/22: SR with occasional PVCs 01/02/22 01/02/22: SR 96 occasional PVCs  Lung CT: Date: 02/03/21 Results: Aortic atherosclerosis 3VD CAC  ECHO COMPLETE WO IMAGING ENHANCING AGENT 01/16/2022  Narrative ECHOCARDIOGRAM REPORT    Patient Name:   Anthony Summers Date of Exam: 01/16/2022 Medical Rec #:  [id number]         Height:       73.0 in Accession #:    [id number]        Weight:       217.0 lb Date of Birth:  1956-01-04         BSA:          2.228 m Patient Age:    29 years          BP:           136/68 mmHg Patient Gender: M                 HR:           87 bpm. Exam Location:  [facility]  Procedure: 2D Echo, 3D Echo, Color Doppler, Limited Color Doppler and Strain Analysis  Indications:    Z01.818 Pre-Operative Eval for Abdominal Aortic Aneurysm Repair  History:        Patient has no prior history of Echocardiogram examinations. Arrythmias:PVC; Risk Factors:Hypertension, Dyslipidemia and Current Smoker. Abdominal Aortic Aneurysm.  Sonographer:    Deliah Boston RDCS Referring Phys: [facility]  IMPRESSIONS   1. Left ventricular ejection fraction by 3D volume is 69 %. The left ventricle has normal function. The left ventricle has no regional wall motion abnormalities. Left ventricular diastolic parameters are consistent with Grade I diastolic dysfunction (impaired relaxation). The average left ventricular global longitudinal strain is 23.3 %. The global longitudinal strain is normal. 2. Right ventricular systolic function is normal. The right ventricular size is normal. 3. The mitral valve is normal in structure. No evidence of mitral valve regurgitation. No evidence of  mitral stenosis. 4. The aortic valve is normal in structure. Aortic valve regurgitation is not visualized. No aortic stenosis is present. 5. There is borderline dilatation of the ascending aorta, measuring 39 mm. 6. The inferior vena cava is normal in size with greater than 50% respiratory variability, suggesting right atrial pressure of 3 mmHg.  FINDINGS Left Ventricle: Left ventricular ejection

## 2022-09-19 ENCOUNTER — Telehealth: Payer: Self-pay | Admitting: *Deleted

## 2022-09-19 NOTE — Telephone Encounter (Signed)
CALLED PATIENT TO REMIND OF PRE-SEED APPTS. FOR 09-20-22- ARRIVAL TIME- 8:45 AM @ [facility], LVM FOR A RETURN CALL

## 2022-09-20 ENCOUNTER — Ambulatory Visit
Admission: RE | Admit: 2022-09-20 | Discharge: 2022-09-20 | Disposition: A | Discharge status: Home / Self Care | Payer: Medicare Other | Source: Ambulatory Visit | Attending: Urology | Admitting: Urology

## 2022-09-20 ENCOUNTER — Encounter: Payer: Self-pay | Admitting: Urology

## 2022-09-20 ENCOUNTER — Other Ambulatory Visit: Payer: Self-pay

## 2022-09-20 ENCOUNTER — Ambulatory Visit
Admission: RE | Admit: 2022-09-20 | Discharge: 2022-09-20 | Disposition: A | Discharge status: Home / Self Care | Payer: Medicare Other | Source: Ambulatory Visit | Attending: Radiation Oncology | Admitting: Radiation Oncology

## 2022-09-20 VITALS — Resp 19 | Ht 72.0 in | Wt 196.0 lb

## 2022-09-20 DIAGNOSIS — Z510 Encounter for antineoplastic radiation therapy: Secondary | ICD-10-CM | POA: Diagnosis present

## 2022-09-20 DIAGNOSIS — C61 Malignant neoplasm of prostate: Secondary | ICD-10-CM | POA: Insufficient documentation

## 2022-09-20 NOTE — Progress Notes (Signed)
Pre-seed appointment. I verified patient's identity and began nursing interview w/ spouse Mrs. Joey Lierman in attendance. Patient doing well. No discomfort reported at this time.  Meaningful use complete. No urinary management medications. Urology appt- Dec 17th, 2023  Resp 19   Ht 6' (1.829 m)   Wt 196 lb (88.9 kg)   BMI 26.58 kg/m

## 2022-09-20 NOTE — Progress Notes (Signed)
Radiation Oncology         [phone] ________________________________  Outpatient Follow up- Pre-seed visit  Name: Anthony Summers MRN: [id number]  Date: 09/20/2022  DOB: 07/12/1956  CC:Collins, Dana, DO  Newsome, George B, MD   REFERRING PHYSICIAN: Newsome, George B, MD  DIAGNOSIS: 66 y.o. gentleman with Stage T1c adenocarcinoma of the prostate with Gleason score of 4+3, and PSA of 10.    ICD-10-CM   1. Malignant neoplasm of prostate (HCC)  C61       HISTORY OF PRESENT ILLNESS: Anthony Summers is a 66 y.o. male with a diagnosis of prostate cancer. He was noted to have an elevated PSA of 8.2 by his primary care physician, Dr. Collins.  Accordingly, he was referred for evaluation in urology by Dr. Newsome on 01/15/22,  digital rectal examination was performed at that time revealing no nodules. Repeat PSA in 04/2022 showed a further rise to 9.95. The patient proceeded to transrectal ultrasound with 12 biopsies of the prostate on 05/31/22.  The prostate volume measured 23.1 cc.  Out of 12 core biopsies, 5 were positive.  The maximum Gleason score was 4+3, and this was seen in right mid, right base lateral, and right base. Additionally, Gleason 3+3 was seen in right apex lateral (small focus) and left apex.   He underwent PSMA PET scan on 06/19/22 showing activity in the right hemi-prostate but no signs of distant disease.  The patient reviewed the biopsy results with his urologist and was kindly referred to us for discussion of potential radiation treatment options. We initially met the patient on 07/09/22 and he was undecided at that time but after further consideration of his options, he has elected to proceed with brachytherapy and SpaceOAR gel placement for treatment of his disease. He is here today for his pre-procedure imaging for planning and to answer any additional questions he may have about this treatment.   PREVIOUS RADIATION THERAPY: No  PAST MEDICAL HISTORY:  Past Medical  History:  Diagnosis Date   Hyperlipidemia    Hypertension    Pneumonia       PAST SURGICAL HISTORY: Past Surgical History:  Procedure Laterality Date   ABDOMINAL AORTIC ENDOVASCULAR STENT GRAFT Bilateral 01/24/2022   Procedure: ENDOVASCULAR AORTIC STENT GRAFT REPAIR;  Surgeon: Brabham, Vance W, MD;  Location: [facility] OR;  Service: Vascular;  Laterality: Bilateral;   ABDOMINAL SURGERY     SHOULDER SURGERY     TONSILLECTOMY     ULTRASOUND GUIDANCE FOR VASCULAR ACCESS Bilateral 01/24/2022   Procedure: ULTRASOUND GUIDANCE FOR VASCULAR ACCESS;  Surgeon: Brabham, Vance W, MD;  Location: [facility] OR;  Service: Vascular;  Laterality: Bilateral;    FAMILY HISTORY: No family history on file.  SOCIAL HISTORY:  Social History   Socioeconomic History   Marital status: Married    Spouse name: Jessica   Number of children: 2   Years of education: Not on file   Highest education level: Not on file  Occupational History   Not on file  Tobacco Use   Smoking status: Every Day    Packs/day: 1.00    Types: Cigarettes   Smokeless tobacco: Never  Vaping Use   Vaping Use: Never used  Substance and Sexual Activity   Alcohol use: Yes    Comment: daily   Drug use: No   Sexual activity: Yes  Other Topics Concern   Not on file  Social History Narrative   Not on file   Social Determinants of Health     Radiation Oncology         [phone] ________________________________  Outpatient Follow up- Pre-seed visit  Name: Anthony Summers MRN: [id number]  Date: 09/20/2022  DOB: 07/12/1956  CC:Collins, Dana, DO  Newsome, George B, MD   REFERRING PHYSICIAN: Newsome, George B, MD  DIAGNOSIS: 66 y.o. gentleman with Stage T1c adenocarcinoma of the prostate with Gleason score of 4+3, and PSA of 10.    ICD-10-CM   1. Malignant neoplasm of prostate (HCC)  C61       HISTORY OF PRESENT ILLNESS: Anthony Summers is a 66 y.o. male with a diagnosis of prostate cancer. He was noted to have an elevated PSA of 8.2 by his primary care physician, Dr. Collins.  Accordingly, he was referred for evaluation in urology by Dr. Newsome on 01/15/22,  digital rectal examination was performed at that time revealing no nodules. Repeat PSA in 04/2022 showed a further rise to 9.95. The patient proceeded to transrectal ultrasound with 12 biopsies of the prostate on 05/31/22.  The prostate volume measured 23.1 cc.  Out of 12 core biopsies, 5 were positive.  The maximum Gleason score was 4+3, and this was seen in right mid, right base lateral, and right base. Additionally, Gleason 3+3 was seen in right apex lateral (small focus) and left apex.   He underwent PSMA PET scan on 06/19/22 showing activity in the right hemi-prostate but no signs of distant disease.  The patient reviewed the biopsy results with his urologist and was kindly referred to us for discussion of potential radiation treatment options. We initially met the patient on 07/09/22 and he was undecided at that time but after further consideration of his options, he has elected to proceed with brachytherapy and SpaceOAR gel placement for treatment of his disease. He is here today for his pre-procedure imaging for planning and to answer any additional questions he may have about this treatment.   PREVIOUS RADIATION THERAPY: No  PAST MEDICAL HISTORY:  Past Medical  History:  Diagnosis Date   Hyperlipidemia    Hypertension    Pneumonia       PAST SURGICAL HISTORY: Past Surgical History:  Procedure Laterality Date   ABDOMINAL AORTIC ENDOVASCULAR STENT GRAFT Bilateral 01/24/2022   Procedure: ENDOVASCULAR AORTIC STENT GRAFT REPAIR;  Surgeon: Brabham, Vance W, MD;  Location: [facility];  Service: Vascular;  Laterality: Bilateral;   ABDOMINAL SURGERY     SHOULDER SURGERY     TONSILLECTOMY     ULTRASOUND GUIDANCE FOR VASCULAR ACCESS Bilateral 01/24/2022   Procedure: ULTRASOUND GUIDANCE FOR VASCULAR ACCESS;  Surgeon: Brabham, Vance W, MD;  Location: [facility];  Service: Vascular;  Laterality: Bilateral;    FAMILY HISTORY: No family history on file.  SOCIAL HISTORY:  Social History   Socioeconomic History   Marital status: Married    Spouse name: Jessica   Number of children: 2   Years of education: Not on file   Highest education level: Not on file  Occupational History   Not on file  Tobacco Use   Smoking status: Every Day    Packs/day: 1.00    Types: Cigarettes   Smokeless tobacco: Never  Vaping Use   Vaping Use: Never used  Substance and Sexual Activity   Alcohol use: Yes    Comment: daily   Drug use: No   Sexual activity: Yes  Other Topics Concern   Not on file  Social History Narrative   Not on file   Social Determinants of Health     Radiation Oncology         [phone] ________________________________  Outpatient Follow up- Pre-seed visit  Name: Anthony Summers MRN: [id number]  Date: 09/20/2022  DOB: 07/12/1956  CC:Collins, Dana, DO  Newsome, George B, MD   REFERRING PHYSICIAN: Newsome, George B, MD  DIAGNOSIS: 66 y.o. gentleman with Stage T1c adenocarcinoma of the prostate with Gleason score of 4+3, and PSA of 10.    ICD-10-CM   1. Malignant neoplasm of prostate (HCC)  C61       HISTORY OF PRESENT ILLNESS: Anthony Summers is a 66 y.o. male with a diagnosis of prostate cancer. He was noted to have an elevated PSA of 8.2 by his primary care physician, Dr. Collins.  Accordingly, he was referred for evaluation in urology by Dr. Newsome on 01/15/22,  digital rectal examination was performed at that time revealing no nodules. Repeat PSA in 04/2022 showed a further rise to 9.95. The patient proceeded to transrectal ultrasound with 12 biopsies of the prostate on 05/31/22.  The prostate volume measured 23.1 cc.  Out of 12 core biopsies, 5 were positive.  The maximum Gleason score was 4+3, and this was seen in right mid, right base lateral, and right base. Additionally, Gleason 3+3 was seen in right apex lateral (small focus) and left apex.   He underwent PSMA PET scan on 06/19/22 showing activity in the right hemi-prostate but no signs of distant disease.  The patient reviewed the biopsy results with his urologist and was kindly referred to us for discussion of potential radiation treatment options. We initially met the patient on 07/09/22 and he was undecided at that time but after further consideration of his options, he has elected to proceed with brachytherapy and SpaceOAR gel placement for treatment of his disease. He is here today for his pre-procedure imaging for planning and to answer any additional questions he may have about this treatment.   PREVIOUS RADIATION THERAPY: No  PAST MEDICAL HISTORY:  Past Medical  History:  Diagnosis Date   Hyperlipidemia    Hypertension    Pneumonia       PAST SURGICAL HISTORY: Past Surgical History:  Procedure Laterality Date   ABDOMINAL AORTIC ENDOVASCULAR STENT GRAFT Bilateral 01/24/2022   Procedure: ENDOVASCULAR AORTIC STENT GRAFT REPAIR;  Surgeon: Brabham, Vance W, MD;  Location: [facility];  Service: Vascular;  Laterality: Bilateral;   ABDOMINAL SURGERY     SHOULDER SURGERY     TONSILLECTOMY     ULTRASOUND GUIDANCE FOR VASCULAR ACCESS Bilateral 01/24/2022   Procedure: ULTRASOUND GUIDANCE FOR VASCULAR ACCESS;  Surgeon: Brabham, Vance W, MD;  Location: [facility];  Service: Vascular;  Laterality: Bilateral;    FAMILY HISTORY: No family history on file.  SOCIAL HISTORY:  Social History   Socioeconomic History   Marital status: Married    Spouse name: Jessica   Number of children: 2   Years of education: Not on file   Highest education level: Not on file  Occupational History   Not on file  Tobacco Use   Smoking status: Every Day    Packs/day: 1.00    Types: Cigarettes   Smokeless tobacco: Never  Vaping Use   Vaping Use: Never used  Substance and Sexual Activity   Alcohol use: Yes    Comment: daily   Drug use: No   Sexual activity: Yes  Other Topics Concern   Not on file  Social History Narrative   Not on file   Social Determinants of Health   

## 2022-09-20 NOTE — Progress Notes (Signed)
  Radiation Oncology         [phone] ________________________________  Name: Anthony Summers MRN: [id number]  Date: 09/20/2022  DOB: [birth year]  SIMULATION AND TREATMENT PLANNING NOTE PUBIC ARCH STUDY  EF:[id number], Hinton Dyer, DO  Remi Haggard, MD  DIAGNOSIS: 66 y.o. gentleman with Stage T1c adenocarcinoma of the prostate with Gleason score of 4+3, and PSA of 10.  Oncology History   No history exists.      ICD-10-CM   1. Malignant neoplasm of prostate ([place])  C61       COMPLEX SIMULATION:  The patient presented today for evaluation for possible prostate seed implant. He was brought to the radiation planning suite and placed supine on the CT couch. A 3-dimensional image study set was obtained in upload to the planning computer. There, on each axial slice, I contoured the prostate gland. Then, using three-dimensional radiation planning tools I reconstructed the prostate in view of the structures from the transperineal needle pathway to assess for possible pubic arch interference. In doing so, I did not appreciate any pubic arch interference. Also, the patient's prostate volume was estimated based on the drawn structure. The volume was 24 cc.  Given the pubic arch appearance and prostate volume, patient remains a good candidate to proceed with prostate seed implant. Today, he freely provided informed written consent to proceed.    PLAN: The patient will undergo prostate seed implant.   ________________________________  Sheral Apley. Tammi Klippel, M.D.

## 2022-10-24 DIAGNOSIS — C61 Malignant neoplasm of prostate: Secondary | ICD-10-CM

## 2022-10-24 HISTORY — DX: Malignant neoplasm of prostate: C61

## 2022-10-25 ENCOUNTER — Encounter (HOSPITAL_BASED_OUTPATIENT_CLINIC_OR_DEPARTMENT_OTHER): Payer: Self-pay | Admitting: Urology

## 2022-10-26 ENCOUNTER — Encounter (HOSPITAL_BASED_OUTPATIENT_CLINIC_OR_DEPARTMENT_OTHER): Payer: Self-pay | Admitting: Urology

## 2022-10-26 NOTE — Progress Notes (Signed)
Spoke w/ via phone for pre-op interview--- pt Lab needs dos---- [employer] results------ current EKG in epic/ chart COVID test -----patient states asymptomatic no test needed Arrive at -------  1100 on 10-30-2022 NPO after MN NO Solid Food.  Clear liquids from MN until--- 1000 Med rec completed Medications to take morning of surgery ----- none Diabetic medication -----  n/a Patient instructed no nail polish to be worn day of surgery Patient instructed to bring photo id and insurance card day of surgery Patient aware to have Driver (ride ) / caregiver for 24 hours after surgery -- wife, Janett Billow Patient Special Instructions ----- will do fleet anema morning of surgery. Asked to bring rescue inhaler dos.  Pre-Op special Istructions ----- pt stated was given instruction from Dr Milford Cage office to stop asa week prior to surgery ,  stated last dose 10-22-2022 Patient verbalized understanding of instructions that were given at this phone interview. Patient denies shortness of breath, chest pain, fever, cough at this phone interview.   Anesthesia Review: HTN;  s/p AAA repair w/ graft 02/ 2023;  copd still smokes (pt denies sob , walks 3 miles per day)   PCP: Dr D. Collins Cardiologist : Dr Gasper [facility] 08-22-2022) Vascular:  Dr Chaya [facility] 03-19-2022 Chest x-ray : CT 02-03-2022 EKG : 08-22-2022 Echo : 01-16-2022 Stress test: no Cardiac Cath :  no   Blood Thinner/ Instructions Maryjane Hurter Dose: no ASA / Instructions/ Last Dose : asa 81 mg,  last does 10-22-2022  per surgeon instructions

## 2022-10-29 ENCOUNTER — Telehealth: Payer: Self-pay | Admitting: *Deleted

## 2022-10-29 NOTE — H&P (Signed)
The patient is a 66 year old white male seen today for evaluation of elevated PSA noted on recent routine physical exam. Patient's baseline PSA 2.6 back in 2020. He did not have any PSAs drawn in 2021 however more recently had a PSA of 7.6 in September 2022 and repeat value in December 2022 is now 8.2. There is no family history of prostate cancer. Patient has minimal if any voiding symptoms. Patient does have history of AAA and is scheduled for repair with endograft hopefully within the next couple of months by his report. Here for evaluation and management of elevated PSA  -05/14/22-patient with history of elevated PSA up to 8.2 back in December 2022. Patient was being scheduled for AAA repair at that time and we opted for observation. His most recent PSA is now 9.95 on 05/08/2022 in the interim the patient has had an endograft for his AAA repair and he has had no post procedural issues with this.  Micro urinalysis  -05/31/22-patient with history of elevated PSA up to 9.95 as above. Here for transrectal ultrasound and prostate biopsy  -06/07/22-patient with history of PSA of 9.95 underwent transrectal ultrasound prostate biopsy on 05/31/2022. Pathology unfortunately shows adenocarcinoma in 5 out of the 12 cores. Here to discuss next steps of management.  05/31/22-TRUS/BX PSA 9.95 VOLUME: 23.1gm  PATH: Adenocarcinoma the prostate in 5 out of 12 cores.  Left apex Gleason score 3+3 equal 6 involving 20% of 1 core  Right base adenocarcinoma Gleason score 4+3 equal 7 involving 70% of 1 core pattern 4 equals 70%  Right mid prostate adenocarcinoma Gleason score 4+3 equal 7 involving 80% of 1 core pattern 4 equals 70%  Right lateral base prostatic adenocarcinoma Gleason score 4+3 equal 7 involving 40% of 1 core pattern 4 equals 70%  Right lateral apex small focus of prostatic adenocarcinoma Gleason score 3+3 equal 6 involving 5% of 1 core  NCCN:Unfavorable Intermediate   -06/22/22-patient with recent diagnosis of  adenocarcinoma the prostate via biopsy on 05/31/2022. Was unfavorable intermediate based on NCCN guidelines and therefore PET scan was obtained on 06/20/2022. Fortunately shows no signs of metastatic disease. PSMA activity was significant in the right hemiprostate. See report below. Here to discuss next steps of management.   CLINICAL DATA: A 66 year old male presents with newly diagnosed  prostate cancer for PSMA evaluation.   EXAM:  NUCLEAR MEDICINE PET SKULL BASE TO THIGH   TECHNIQUE:  9.95 mCi F18 Piflufolastat (Pylarify) was injected intravenously.  Full-ring PET imaging was performed from the skull base to thigh  after the radiotracer. CT data was obtained and used for attenuation  correction and anatomic localization.   COMPARISON: CT of the abdomen and pelvis from March of 2023.   FINDINGS:  NECK   No radiotracer activity in neck lymph nodes.   Incidental CT finding: None   CHEST   Is   Incidental CT finding: Aortic atherosclerosis and coronary artery  calcification. Normal heart size without pericardial effusion. No  adenopathy the by size criteria. Lungs are clear. Airways are  patent.   ABDOMEN/PELVIS   Prostate: Focal activity in the RIGHT prostate in the posterolateral  gland with a maximum SUV of 10.74. (Image 217/4)   Lymph nodes: No abnormal radiotracer accumulation within pelvic or  abdominal nodes.   Liver: No evidence of liver metastasis   Incidental CT finding: Moderate to marked hepatic steatosis. Subtle  area in the LEFT hepatic lobe likely corresponding to known  hemangioma in this area better characterized on prior  MR imaging.  Cholelithiasis. Other known hemangiomata not well seen on the  current study. No acute findings relative to pancreas, spleen,  adrenal glands and kidneys with stable scarring of the RIGHT kidney.  No acute gastrointestinal process. Colonic diverticulosis. Normal  appendix.   Aortic atherosclerosis with signs of aorto  bi-iliac endograft  placement. Endo sac at 5.3 x 4.9 as compared to 5.4 x 5.0 cm. Small  fat containing LEFT inguinal hernia. No adenopathy by size criteria  in the abdomen or in the pelvis. Postoperative changes in the  midline of the anterior abdomen.   SKELETON   No signs of skeletal metastasis.   Avascular necrosis of the bilateral femoral heads without collapse.  Signs of hip degenerative change and degenerative change throughout  the spine.   Uptake along the RIGHT anterior chest within RIGHT-sided ribs 2, 3,  4 near the costochondral junctions with linear pattern.   IMPRESSION:  PSMA activity in the RIGHT hemi prostate compatible with the site of  patient's primary prostate neoplasm. No signs of distant disease.   Severe hepatic steatosis.   Cholelithiasis.   Signs of prior trauma to the RIGHT anterior chest, subtle rib  fractures may be recent but show no displacement.   Postoperative changes of aorto bi-iliac endograft placement.   Cholelithiasis.   Bilateral femoral head AVN.   Aortic atherosclerosis.   Aortic Atherosclerosis (ICD10-I70.0).    Electronically Signed  By: Zetta Bills M.D.  On: 06/20/2022 16:19   10/18/2022: 66 year old man who presents with wife at his side today for preoperative appointment prior to undergoing brachytherapy seed placement along with SpaceOAR. His surgery is scheduled for 10/30/2022. He endorses no new procedures or medications since last seen. He does take asprin daily. He continues at basline urinary function. Patient had several questions with regards to his procedure. Patient denies chest pain, shortness of breath, dyspnea on exertion, fever/chills, current infection, or antibiotic use. UA WNL.     ALLERGIES: No Allergies    MEDICATIONS: Levofloxacin 750 mg tablet Take 1 tab po 1 hour prior to procedure  Albuterol Sulfate Hfa 90 mcg hfa aerosol with adapter  ALPRAZolam ER 0.5 MG Oral Tablet Extended Release 24 Hour Oral   Amoxicillin 500 mg capsule  Androgel 20.25 mg/1.25 gram per actuation (1.62 %) gel in metered-dose pump Transdermal  Ezetimibe 10 mg tablet  Furosemide 20 mg tablet  Icaps Areds2  Losartan Potassium 50 mg tablet  Multi-Vitamin TABS Oral  Prednisone 10 mg tablet  Vitamin B Complex  Vitamin D3     GU PSH: Prostate Needle Biopsy - 05/31/2022       PSH Notes: Gastric Surgery, Shoulder Surgery   NON-GU PSH: Surgical Pathology, Gross And Microscopic Examination For Prostate Needle - 05/31/2022     GU PMH: BPH w/o LUTS - 06/22/2022, - 01/15/2022 Prostate Cancer - 06/22/2022, - 06/07/2022 Elevated PSA - 05/31/2022, - 05/14/2022, - 01/15/2022 BPH w/LUTS, Benign localized prostatic hyperplasia with lower urinary tract symptoms (LUTS) - 2014 Hematospermia, Hematospermia - 2014      PMH Notes:  1898-12-24 00:00:00 - Note: Normal Routine History And Physical Adult   NON-GU PMH: Anxiety, Anxiety (Symptom) - 2014 Arthritis Hypercholesterolemia Hypertension    FAMILY HISTORY: Family Health Status - Mother's Age - Runs In Family Family Health Status Number - Runs In Family Father Deceased At [employer] ___ - Runs In Family   SOCIAL HISTORY: Marital Status: Married Ethnicity: Not Hispanic Or Latino; Race: White Current Smoking Status: Patient smokes. Has smoked since  12/25/1971.   Tobacco Use Assessment Completed: Used Tobacco in last 30 days? Does drink.  Drinks 3 caffeinated drinks per day.     Notes: Tobacco use, Caffeine Use, Marital History - Currently Married, Alcohol Use   REVIEW OF SYSTEMS:    GU Review Male:   Patient denies frequent urination, hard to postpone urination, burning/ pain with urination, get up at night to urinate, leakage of urine, stream starts and stops, trouble starting your stream, have to strain to urinate , erection problems, and penile pain.  Gastrointestinal (Upper):   Patient denies nausea, vomiting, and indigestion/ heartburn.  Gastrointestinal (Lower):    Patient denies diarrhea and constipation.  Constitutional:   Patient denies fever, night sweats, weight loss, and fatigue.  Skin:   Patient denies skin rash/ lesion and itching.  Eyes:   Patient denies blurred vision and double vision.  Ears/ Nose/ Throat:   Patient denies sore throat and sinus problems.  Hematologic/Lymphatic:   Patient denies swollen glands and easy bruising.  Cardiovascular:   Patient denies leg swelling and chest pains.  Respiratory:   Patient denies cough and shortness of breath.  Endocrine:   Patient denies excessive thirst.  Musculoskeletal:   Patient denies back pain and joint pain.  Neurological:   Patient denies headaches and dizziness.  Psychologic:   Patient denies depression and anxiety.   VITAL SIGNS:      10/18/2022 01:09 PM  BP 127/76 mmHg  Pulse 87 /min  Temperature 97.7 F / 36.5 C   MULTI-SYSTEM PHYSICAL EXAMINATION:    Constitutional: Well-nourished. No physical deformities. Normally developed. Good grooming. Patient appears anxious.  Neck: Neck symmetrical, not swollen. Normal tracheal position.  Respiratory: Bibasilar wheezing. No labored breathing, no use of accessory muscles.   Cardiovascular: Normal temperature, normal extremity pulses, no swelling, no varicosities. No murmur/gallops.  Lymphatic: No enlargement of neck, axillae, groin.  Skin: No paleness, no jaundice, no cyanosis. No lesion, no ulcer, no rash.  Neurologic / Psychiatric: Oriented to time, oriented to place, oriented to person. No depression, no anxiety, no agitation.  Gastrointestinal: No mass, no bilateral CVA or suprapubic tenderness, no rigidity, non obese abdomen.   Musculoskeletal: Normal gait and station of head and neck.     Complexity of Data:  Source Of History:  Patient, Family/Caregiver, Medical Record Summary  Records Review:   Pathology Reports, Previous Doctor Records, Previous Patient Records  Urine Test Review:   Urinalysis   05/08/22 10/31/06  PSA  Total  PSA 9.95 ng/mL 1.48   Free PSA 0.52 ng/mL   % Free PSA 5 % PSA     10/18/22  Urinalysis  Urine Appearance Clear   Urine Color Amber   Urine Glucose Neg mg/dL  Urine Bilirubin Neg mg/dL  Urine Ketones Neg mg/dL  Urine Specific Gravity 1.015   Urine Blood Neg ery/uL  Urine pH 6.5   Urine Protein Trace mg/dL  Urine Urobilinogen 0.2 mg/dL  Urine Nitrites Neg   Urine Leukocyte Esterase Neg leu/uL   PROCEDURES:          Urinalysis Dipstick Dipstick Cont'd  Color: Amber Bilirubin: Neg mg/dL  Appearance: Clear Ketones: Neg mg/dL  Specific Gravity: 1.015 Blood: Neg ery/uL  pH: 6.5 Protein: Trace mg/dL  Glucose: Neg mg/dL Urobilinogen: 0.2 mg/dL    Nitrites: Neg    Leukocyte Esterase: Neg leu/uL    ASSESSMENT:      ICD-10 Details  1 GU:   Prostate Cancer - C61 Chronic, Stable   PLAN:  Orders Labs CULTURE, URINE          Schedule Return Visit/Planned Activity: Keep Scheduled Appointment          Document Letter(s):  Created for Patient: Clinical Summary         Notes:   Today, UA WNL. We will send for precautionary urine culture and follow-up as appropriate based on results. We reviewed in detail preop recommendations, and advised patient to stop his aspirin 5 days ahead of surgery date. Patient understands that should he experience any changes to his medications, or undergo new procedures prior to planned date of surgery, he is to contact the office to discuss these. All questions regarding procedure answered to the best of my ability. Patient voiced understanding and is amenable to this plan.

## 2022-10-29 NOTE — Telephone Encounter (Signed)
CALLED PATIENT TO REMIND OF PROCEDURE FOR 10-30-22, SPOKE WITH PATIENT AND HE IS AWARE OF THIS PROCEDURE

## 2022-10-30 ENCOUNTER — Encounter (HOSPITAL_BASED_OUTPATIENT_CLINIC_OR_DEPARTMENT_OTHER)
Admission: RE | Disposition: A | Discharge status: Home / Self Care | Payer: Self-pay | Source: Home / Self Care | Attending: Urology

## 2022-10-30 ENCOUNTER — Ambulatory Visit (HOSPITAL_BASED_OUTPATIENT_CLINIC_OR_DEPARTMENT_OTHER): Payer: Medicare Other | Admitting: Anesthesiology

## 2022-10-30 ENCOUNTER — Other Ambulatory Visit: Payer: Self-pay

## 2022-10-30 ENCOUNTER — Ambulatory Visit (HOSPITAL_COMMUNITY): Payer: Medicare Other

## 2022-10-30 ENCOUNTER — Encounter (HOSPITAL_BASED_OUTPATIENT_CLINIC_OR_DEPARTMENT_OTHER): Payer: Self-pay | Admitting: Urology

## 2022-10-30 ENCOUNTER — Ambulatory Visit (HOSPITAL_BASED_OUTPATIENT_CLINIC_OR_DEPARTMENT_OTHER)
Admission: RE | Admit: 2022-10-30 | Discharge: 2022-10-30 | Disposition: A | Discharge status: Home / Self Care | Payer: Medicare Other | Attending: Urology | Admitting: Urology

## 2022-10-30 DIAGNOSIS — K8020 Calculus of gallbladder without cholecystitis without obstruction: Secondary | ICD-10-CM | POA: Insufficient documentation

## 2022-10-30 DIAGNOSIS — C61 Malignant neoplasm of prostate: Secondary | ICD-10-CM | POA: Diagnosis present

## 2022-10-30 DIAGNOSIS — I700 Atherosclerosis of aorta: Secondary | ICD-10-CM | POA: Insufficient documentation

## 2022-10-30 DIAGNOSIS — K760 Fatty (change of) liver, not elsewhere classified: Secondary | ICD-10-CM | POA: Diagnosis not present

## 2022-10-30 DIAGNOSIS — Z01818 Encounter for other preprocedural examination: Secondary | ICD-10-CM

## 2022-10-30 HISTORY — DX: Atherosclerosis of aorta: I70.0

## 2022-10-30 HISTORY — DX: Anxiety disorder, unspecified: F41.9

## 2022-10-30 HISTORY — PX: SPACE OAR INSTILLATION: SHX6769

## 2022-10-30 HISTORY — DX: Atherosclerotic heart disease of native coronary artery without angina pectoris: I25.10

## 2022-10-30 HISTORY — DX: Mixed hyperlipidemia: E78.2

## 2022-10-30 HISTORY — DX: Unspecified hearing loss, bilateral: H91.93

## 2022-10-30 HISTORY — DX: Benign prostatic hyperplasia without lower urinary tract symptoms: N40.0

## 2022-10-30 HISTORY — DX: Gout, unspecified: M10.9

## 2022-10-30 HISTORY — DX: Presence of spectacles and contact lenses: Z97.3

## 2022-10-30 HISTORY — PX: RADIOACTIVE SEED IMPLANT: SHX5150

## 2022-10-30 HISTORY — DX: Chronic obstructive pulmonary disease, unspecified: J44.9

## 2022-10-30 LAB — POCT I-STAT, CHEM 8
BUN: 11 mg/dL (ref 8–23)
Calcium, Ion: 1.17 mmol/L (ref 1.15–1.40)
Chloride: 96 mmol/L — ABNORMAL LOW (ref 98–111)
Creatinine, Ser: 0.7 mg/dL (ref 0.61–1.24)
Glucose, Bld: 108 mg/dL — ABNORMAL HIGH (ref 70–99)
HCT: 45 % (ref 39.0–52.0)
Hemoglobin: 15.3 g/dL (ref 13.0–17.0)
Potassium: 3.5 mmol/L (ref 3.5–5.1)
Sodium: 136 mmol/L (ref 135–145)
TCO2: 26 mmol/L (ref 22–32)

## 2022-10-30 SURGERY — INSERTION, RADIATION SOURCE, PROSTATE
Anesthesia: General | Site: Prostate

## 2022-10-30 MED ORDER — OXYCODONE HCL 5 MG PO TABS: 5.0000 mg | ORAL_TABLET | Freq: Once | ORAL | Status: DC | PRN

## 2022-10-30 MED ORDER — PROPOFOL 10 MG/ML IV BOLUS
INTRAVENOUS | Status: DC | PRN
  Administered 2022-10-30: 170 mg via INTRAVENOUS

## 2022-10-30 MED ORDER — SODIUM CHLORIDE (PF) 0.9 % IJ SOLN
INTRAMUSCULAR | Status: AC
  Filled 2022-10-30: qty 10

## 2022-10-30 MED ORDER — ALBUTEROL SULFATE HFA 108 (90 BASE) MCG/ACT IN AERS
INHALATION_SPRAY | RESPIRATORY_TRACT | Status: AC
  Filled 2022-10-30: qty 6.7

## 2022-10-30 MED ORDER — LIDOCAINE HCL (CARDIAC) PF 100 MG/5ML IV SOSY
PREFILLED_SYRINGE | INTRAVENOUS | Status: DC | PRN
  Administered 2022-10-30: 100 mg via INTRAVENOUS

## 2022-10-30 MED ORDER — FENTANYL CITRATE (PF) 100 MCG/2ML IJ SOLN
25.0000 ug | INTRAMUSCULAR | Status: DC | PRN
  Administered 2022-10-30 (×3): 25 ug via INTRAVENOUS

## 2022-10-30 MED ORDER — ALBUTEROL SULFATE HFA 108 (90 BASE) MCG/ACT IN AERS
INHALATION_SPRAY | RESPIRATORY_TRACT | Status: DC | PRN
  Administered 2022-10-30: 12 via RESPIRATORY_TRACT

## 2022-10-30 MED ORDER — ACETAMINOPHEN 500 MG PO TABS
ORAL_TABLET | ORAL | Status: AC
  Filled 2022-10-30: qty 2

## 2022-10-30 MED ORDER — TAMSULOSIN HCL 0.4 MG PO CAPS: 0.4000 mg | ORAL_CAPSULE | Freq: Every day | ORAL | 6 refills | Status: DC

## 2022-10-30 MED ORDER — DEXAMETHASONE SODIUM PHOSPHATE 4 MG/ML IJ SOLN
INTRAMUSCULAR | Status: DC | PRN
  Administered 2022-10-30: 4 mg via INTRAVENOUS

## 2022-10-30 MED ORDER — FENTANYL CITRATE (PF) 100 MCG/2ML IJ SOLN
INTRAMUSCULAR | Status: AC
  Filled 2022-10-30: qty 2

## 2022-10-30 MED ORDER — FENTANYL CITRATE (PF) 100 MCG/2ML IJ SOLN
INTRAMUSCULAR | Status: DC | PRN
  Administered 2022-10-30 (×2): 25 ug via INTRAVENOUS
  Administered 2022-10-30: 50 ug via INTRAVENOUS

## 2022-10-30 MED ORDER — IOHEXOL 300 MG/ML  SOLN
INTRAMUSCULAR | Status: DC | PRN
  Administered 2022-10-30: 7 mL

## 2022-10-30 MED ORDER — CIPROFLOXACIN IN D5W 400 MG/200ML IV SOLN
INTRAVENOUS | Status: AC
  Filled 2022-10-30: qty 200

## 2022-10-30 MED ORDER — SODIUM CHLORIDE (PF) 0.9 % IJ SOLN
INTRAMUSCULAR | Status: DC | PRN
  Administered 2022-10-30: 3 mL

## 2022-10-30 MED ORDER — FLEET ENEMA 7-19 GM/118ML RE ENEM: 1.0000 | ENEMA | Freq: Once | RECTAL | Status: DC

## 2022-10-30 MED ORDER — SUGAMMADEX SODIUM 200 MG/2ML IV SOLN: INTRAVENOUS | Status: DC | PRN

## 2022-10-30 MED ORDER — OXYCODONE HCL 5 MG/5ML PO SOLN: 5.0000 mg | Freq: Once | ORAL | Status: DC | PRN

## 2022-10-30 MED ORDER — ONDANSETRON HCL 4 MG/2ML IJ SOLN
INTRAMUSCULAR | Status: DC | PRN
  Administered 2022-10-30: 4 mg via INTRAVENOUS

## 2022-10-30 MED ORDER — SUGAMMADEX SODIUM 200 MG/2ML IV SOLN
INTRAVENOUS | Status: DC | PRN
  Administered 2022-10-30: 200 mg via INTRAVENOUS

## 2022-10-30 MED ORDER — AMISULPRIDE (ANTIEMETIC) 5 MG/2ML IV SOLN: 10.0000 mg | Freq: Once | INTRAVENOUS | Status: DC | PRN

## 2022-10-30 MED ORDER — TRAMADOL HCL 50 MG PO TABS: 50.0000 mg | ORAL_TABLET | Freq: Four times a day (QID) | ORAL | 0 refills | Status: DC | PRN

## 2022-10-30 MED ORDER — ROCURONIUM BROMIDE 100 MG/10ML IV SOLN
INTRAVENOUS | Status: DC | PRN
  Administered 2022-10-30: 60 mg via INTRAVENOUS
  Administered 2022-10-30: 5 mg via INTRAVENOUS

## 2022-10-30 MED ORDER — PROPOFOL 10 MG/ML IV BOLUS
INTRAVENOUS | Status: AC
  Filled 2022-10-30: qty 20

## 2022-10-30 MED ORDER — CIPROFLOXACIN IN D5W 400 MG/200ML IV SOLN
400.0000 mg | INTRAVENOUS | Status: AC
  Administered 2022-10-30: 400 mg via INTRAVENOUS

## 2022-10-30 MED ORDER — ACETAMINOPHEN 500 MG PO TABS
1000.0000 mg | ORAL_TABLET | Freq: Once | ORAL | Status: AC
  Administered 2022-10-30: 1000 mg via ORAL

## 2022-10-30 MED ORDER — LACTATED RINGERS IV SOLN: INTRAVENOUS | Status: DC

## 2022-10-30 SURGICAL SUPPLY — 45 items
BAG DRN RND TRDRP ANRFLXCHMBR (UROLOGICAL SUPPLIES) ×2
BAG URINE DRAIN 2000ML AR STRL (UROLOGICAL SUPPLIES) ×3 IMPLANT
BLADE CLIPPER SENSICLIP SURGIC (BLADE) ×3 IMPLANT
CATH FOLEY 2WAY SLVR  5CC 16FR (CATHETERS) ×2
CATH FOLEY 2WAY SLVR 5CC 16FR (CATHETERS) ×6 IMPLANT
CATH ROBINSON RED A/P 16FR (CATHETERS) IMPLANT
CATH ROBINSON RED A/P 20FR (CATHETERS) ×3 IMPLANT
CLOTH BEACON ORANGE TIMEOUT ST (SAFETY) ×3 IMPLANT
CNTNR URN SCR LID CUP LEK RST (MISCELLANEOUS) ×3 IMPLANT
CONT SPEC 4OZ STRL OR WHT (MISCELLANEOUS) ×2
COVER BACK TABLE 60X90IN (DRAPES) ×3 IMPLANT
COVER MAYO STAND STRL (DRAPES) ×3 IMPLANT
DRSG TEGADERM 4X4.75 (GAUZE/BANDAGES/DRESSINGS) ×3 IMPLANT
DRSG TEGADERM 8X12 (GAUZE/BANDAGES/DRESSINGS) ×3 IMPLANT
GAUZE SPONGE 4X4 12PLY STRL (GAUZE/BANDAGES/DRESSINGS) IMPLANT
GEL ULTRASOUND 20GR AQUASONIC (MISCELLANEOUS) ×6 IMPLANT
GLOVE BIO SURGEON STRL SZ 6.5 (GLOVE) ×3 IMPLANT
GLOVE BIO SURGEON STRL SZ7.5 (GLOVE) ×3 IMPLANT
GLOVE BIOGEL PI IND STRL 6.5 (GLOVE) IMPLANT
GLOVE SURG ORTHO 8.5 STRL (GLOVE) ×3 IMPLANT
GLOVE SURG SS PI 6.5 STRL IVOR (GLOVE) IMPLANT
GOWN STRL REUS W/TWL XL LVL3 (GOWN DISPOSABLE) ×3 IMPLANT
GRID BRACH TEMP 18GA 2.8X3X.75 (MISCELLANEOUS) ×3 IMPLANT
HOLDER FOLEY CATH W/STRAP (MISCELLANEOUS) ×3 IMPLANT
IMPL SPACEOAR VUE SYSTEM (Spacer) ×3 IMPLANT
IMPLANT SPACEOAR VUE SYSTEM (Spacer) ×2 IMPLANT
IV NS 1000ML (IV SOLUTION) ×2
IV NS 1000ML BAXH (IV SOLUTION) ×3 IMPLANT
KIT TURNOVER CYSTO (KITS) ×3 IMPLANT
NDL BRACHY 18G 5PK (NEEDLE) ×12 IMPLANT
NDL BRACHY 18G SINGLE (NEEDLE) IMPLANT
NDL PK MORGANSTERN STABILIZ (NEEDLE) ×3 IMPLANT
NEEDLE BRACHY 18G 5PK (NEEDLE) ×8 IMPLANT
NEEDLE BRACHY 18G SINGLE (NEEDLE) IMPLANT
NEEDLE PK MORGANSTERN STABILIZ (NEEDLE) ×2 IMPLANT
PACK CYSTO (CUSTOM PROCEDURE TRAY) ×3 IMPLANT
SHEATH ULTRASOUND LF (SHEATH) IMPLANT
SHEATH ULTRASOUND LTX NONSTRL (SHEATH) IMPLANT
SUT BONE WAX W31G (SUTURE) IMPLANT
SYR 10ML LL (SYRINGE) ×6 IMPLANT
SYR CONTROL 10ML LL (SYRINGE) ×3 IMPLANT
TOWEL OR 17X26 10 PK STRL BLUE (TOWEL DISPOSABLE) ×3 IMPLANT
UNDERPAD 30X36 HEAVY ABSORB (UNDERPADS AND DIAPERS) ×6 IMPLANT
WATER STERILE IRR 500ML POUR (IV SOLUTION) ×3 IMPLANT
quickling IMPLANT

## 2022-10-30 NOTE — Anesthesia Preprocedure Evaluation (Addendum)
Anesthesia Evaluation  Patient identified by MRN, date of birth, ID band Patient awake    Reviewed: Allergy & Precautions, NPO status , Patient's Chart, lab work & pertinent test results  History of Anesthesia Complications Negative for: history of anesthetic complications  Airway Mallampati: II  TM Distance: >3 FB Neck ROM: Full    Dental  (+) Loose, Missing,    Pulmonary COPD, Current Smoker   Pulmonary exam normal        Cardiovascular hypertension, Pt. on medications + CAD  Normal cardiovascular exam     Neuro/Psych   Anxiety Depression    negative neurological ROS     GI/Hepatic negative GI ROS, Neg liver ROS,,,  Endo/Other  negative endocrine ROS    Renal/GU negative Renal ROS  negative genitourinary   Musculoskeletal negative musculoskeletal ROS (+)    Abdominal   Peds  Hematology negative hematology ROS (+)   Anesthesia Other Findings Day of surgery medications reviewed with patient.  Reproductive/Obstetrics negative OB ROS                             Anesthesia Physical Anesthesia Plan  ASA: 2  Anesthesia Plan: General   Post-op Pain Management: Tylenol PO (pre-op)*   Induction: Intravenous  PONV Risk Score and Plan: 2 and Treatment may vary due to age or medical condition, Ondansetron, Dexamethasone and Midazolam  Airway Management Planned: Oral ETT  Additional Equipment: None  Intra-op Plan:   Post-operative Plan: Extubation in OR  Informed Consent: I have reviewed the patients History and Physical, chart, labs and discussed the procedure including the risks, benefits and alternatives for the proposed anesthesia with the patient or authorized representative who has indicated his/her understanding and acceptance.     Dental advisory given  Plan Discussed with: CRNA  Anesthesia Plan Comments:        Anesthesia Quick Evaluation

## 2022-10-30 NOTE — Op Note (Signed)
Preoperative diagnosis: Clinically localized adenocarcinoma of the prostate (T1c)  Postoperative diagnosis: Clinically localized adenocarcinoma of the prostate  Procedure: 1) Transperineal placement of radioactive seeds into the prostate                    2) Cystoscopy                    3) Insertion of SpaceOAR hydrogel   Surgeon: Dr Harold Barban  Radiation oncologist: Tammi Klippel  Anesthesia: General  EBL: Minimal  Complications: None  Indication: Anthony Summers is a 66 y.o. gentleman with clinically localized prostate cancer. After discussing management options for treatment, he elected to proceed with radiotherapy. He presents today for the above procedures. The potential risks, complications, alternative options, and expected recovery course have been discussed in detail with the patient and he has provided informed consent to proceed.  Description of procedure: The patient was taken to the operating room and general anesthesia was induced. He was administered preoperative antibiotics, placed in the dorsal lithotomy position, and prepped and draped in the usual sterile fashion. Next, intraoperative transrectal ultrasonography was utilized for real-time intraoperative planning by the radiation oncology team. Once the treatment plan was completed and the seed strands created, stranded iodine 125 radiation seeds were placed utilizing a brachytherapy perineal template. 74 radioactive iodine 125 seeds into the prostate through 18 catheter needles.  The brachytherapy template was then removed.  A site in the midline was selected on the perineum for placement of an 18 g needle with saline.  The needle was advanced above the rectum and below Denonvillier's fascia to the mid gland and confirmed to be in the midline on transverse imaging.  One cc of saline was injected confirming appropriate expansion of this space.  A total of 5 cc of saline was then injected to open the space further bilaterally.   The saline syringe was then removed and the SpaceOAR hydrogel was injected with good distribution bilaterally. Position of the radiation seeds was confirmed on fluoroscopic imaging.  Flexible cystoscopy was then performed and no seeds were identified within the bladder.  No bladder tumors, stones, or other mucosal pathology was identified within the bladder. He tolerated the procedure well and without complications. He was able to be transferred to the recovery unit in satisfactory condition.  He was given a voiding trial in the PACU.

## 2022-10-30 NOTE — Progress Notes (Signed)
Radiation Oncology         [phone] ________________________________  Name: GERRELL TABET MRN: [id number]  Date: 10/30/2022  DOB: [birth year]       Prostate Seed Implant  JK:[id number], Hinton Dyer, DO  No ref. provider found  DIAGNOSIS:  66 y.o. gentleman with Stage T1c adenocarcinoma of the prostate with Gleason score of 4+3, and PSA of 10.  Oncology History  Malignant neoplasm of prostate ([place])  05/31/2022 Cancer Staging   Staging form: Prostate, AJCC 8th Edition - Clinical stage from 05/31/2022: Stage IIC (cT1c, cN0, cM0, PSA: 10, Grade Group: 3) - Signed by Freeman Caldron, PA-C on 09/20/2022 Histopathologic type: Adenocarcinoma, NOS Stage prefix: Initial diagnosis Prostate specific antigen (PSA) range: 10 to 19 Gleason primary pattern: 4 Gleason secondary pattern: 3 Gleason score: 7 Histologic grading system: 5 grade system Number of biopsy cores examined: 12 Number of biopsy cores positive: 5 Location of positive needle core biopsies: Both sides   07/09/2022 Initial Diagnosis   Malignant neoplasm of prostate ([place])       ICD-10-CM   1. Pre-op testing  Z01.818 CBG per Guidelines for Diabetes Management for Patients Undergoing Surgery ([facility], [facility], and [facility] only)    CBG per protocol    I-Stat, Chem 8 on day of surgery per protocol    CBG per Guidelines for Diabetes Management for Patients Undergoing Surgery ([facility], [facility], and [facility] only)    CBG per protocol    I-Stat, Chem 8 on day of surgery per protocol      PROCEDURE: Insertion of radioactive I-125 seeds into the prostate gland.  RADIATION DOSE: 145 Gy, definitive therapy.  TECHNIQUE: CASSELL VOORHIES was brought to the operating room with the urologist. He was placed in the dorsolithotomy position. He was catheterized and a rectal tube was inserted. The perineum was shaved, prepped and draped. The ultrasound probe was then introduced into the rectum to see the prostate gland.  TREATMENT DEVICE: A needle grid was attached to the  ultrasound probe stand and anchor needles were placed.  3D PLANNING: The prostate was imaged in 3D using a sagittal sweep of the prostate probe. These images were transferred to the planning computer. There, the prostate, urethra and rectum were defined on each axial reconstructed image. Then, the software created an optimized 3D plan and a few seed positions were adjusted. The quality of the plan was reviewed using [facility] information for the target and the following two organs at risk:  Urethra and Rectum.  Then the accepted plan was printed and handed off to the radiation therapist.  Under my supervision, the custom loading of the seeds and spacers was carried out and loaded into sealed vicryl sleeves.  These pre-loaded needles were then placed into the needle holder.Marland Kitchen  PROSTATE VOLUME STUDY:  Using transrectal ultrasound the volume of the prostate was verified to be 25.9 cc.  SPECIAL TREATMENT PROCEDURE/SUPERVISION AND HANDLING: The pre-loaded needles were then delivered under sagittal guidance. A total of 18 needles were used to deposit 66 seeds in the prostate gland. The individual seed activity was 0.32 mCi.  SpaceOAR:  Yes  COMPLEX SIMULATION: At the end of the procedure, an anterior radiograph of the pelvis was obtained to document seed positioning and count. Cystoscopy was performed to check the urethra and bladder.  MICRODOSIMETRY: At the end of the procedure, the patient was emitting 0.07 mR/hr at 1 meter. Accordingly, he was considered safe for hospital discharge.  PLAN: The patient will return to the radiation oncology  clinic for post implant CT dosimetry in three weeks.   ________________________________  Sheral Apley Tammi Klippel, M.D.

## 2022-10-30 NOTE — Interval H&P Note (Signed)
History and Physical Interval Note:  10/30/2022 12:00 PM  Anthony Summers  has presented today for surgery, with the diagnosis of PROSTATE CANCER.  The various methods of treatment have been discussed with the patient and family. After consideration of risks, benefits and other options for treatment, the patient has consented to  Procedure(s): RADIOACTIVE SEED IMPLANT/BRACHYTHERAPY IMPLANT (N/A) SPACE OAR INSTILLATION (N/A) as a surgical intervention.  The patient's history has been reviewed, patient examined, no change in status, stable for surgery.  I have reviewed the patient's chart and labs.  Questions were answered to the patient's satisfaction.     Remi Haggard

## 2022-10-30 NOTE — Discharge Instructions (Addendum)
   Radioactive Seed Implant Home Care Instructions   Activity:    Rest for the remainder of the day.  Do not drive or operate equipment today.  You may resume normal  activities in a few days as instructed by your physician, without risk of harmful radiation exposure to those around you, provided you follow the time and distance precautions on the Radiation Oncology Instruction Sheet.   Meals: Drink plenty of lipuids and eat light foods, such as gelatin or soup this evening .  You may return to normal meal plan tomorrow.  Return To Work: You may return to work as instructed by [employer].  Special Instruction:   If any seeds are found, use tweezers to pick up seeds and place in a glass container of any kind and bring to your physician's office.  Call your physician if any of these symptoms occur:  Persistent or heavy bleeding Urine stream diminishes or stops completely after catheter is removed Fever equal to or greater than 101 degrees F Cloudy urine with a strong foul odor Severe pain  You may feel some burning pain and/or hesitancy when you urinate after the catheter is removed.  These symptoms may increase over the next few weeks, but should diminish within forur to six weeks.  Applying moist heat to the lower abdomen or a hot tub bath may help relieve the pain.  If the discomfort becomes severe, please call your physician for additional medications.      Post Anesthesia Home Care Instructions  Activity: Get plenty of rest for the remainder of the day. A responsible individual must stay with you for 24 hours following the procedure.  For the next 24 hours, DO NOT: -Drive a car -Paediatric nurse -Drink alcoholic beverages -Take any medication unless instructed by your physician -Make any legal decisions or sign important papers.  Meals: Start with liquid foods such as gelatin or soup. Progress to regular foods as tolerated. Avoid greasy, spicy, heavy foods. If nausea  and/or vomiting occur, drink only clear liquids until the nausea and/or vomiting subsides. Call your physician if vomiting continues.  Special Instructions/Symptoms: Your throat may feel dry or sore from the anesthesia or the breathing tube placed in your throat during surgery. If this causes discomfort, gargle with warm salt water. The discomfort should disappear within 24 hours.  Do not take any Tylenol until after 1:45 pm today, if needed.

## 2022-10-30 NOTE — Transfer of Care (Signed)
Immediate Anesthesia Transfer of Care Note  Patient: Anthony Summers  Procedure(s) Performed: RADIOACTIVE SEED IMPLANT/BRACHYTHERAPY IMPLANT (Prostate) SPACE OAR INSTILLATION (Perineum)  Patient Location: PACU  Anesthesia Type:General  Level of Consciousness: awake, alert , oriented, and patient cooperative  Airway & Oxygen Therapy: Patient Spontanous Breathing and Patient connected to nasal cannula oxygen  Post-op Assessment: Report given to RN and Post -op Vital signs reviewed and stable  Post vital signs: Reviewed and stable  Last Vitals:  Vitals Value Taken Time  BP 162/85 10/30/22 1455  Temp    Pulse 92 10/30/22 1456  Resp 17 10/30/22 1456  SpO2 93 % 10/30/22 1456  Vitals shown include unvalidated device data.  Last Pain:  Vitals:   10/30/22 1132  TempSrc: Oral  PainSc: 2       Patients Stated Pain Goal: 3 (20/23/34 3568)  Complications: No notable events documented.

## 2022-10-30 NOTE — Anesthesia Procedure Notes (Signed)
Procedure Name: Intubation Date/Time: 10/30/2022 1:18 PM  Performed by: Georgeanne Nim, CRNAPre-anesthesia Checklist: Patient identified, Emergency Drugs available, Suction available, Patient being monitored and Timeout performed Patient Re-evaluated:Patient Re-evaluated prior to induction Oxygen Delivery Method: Circle system utilized Preoxygenation: Pre-oxygenation with 100% oxygen Induction Type: IV induction Ventilation: Mask ventilation without difficulty Laryngoscope Size: 4 Grade View: Grade I Tube type: Oral Tube size: 7.0 mm Number of attempts: 1 Airway Equipment and Method: Stylet Placement Confirmation: ETT inserted through vocal cords under direct vision, positive ETCO2, CO2 detector and breath sounds checked- equal and bilateral Secured at: 23 cm Tube secured with: Tape Dental Injury: Teeth and Oropharynx as per pre-operative assessment

## 2022-10-31 ENCOUNTER — Encounter (HOSPITAL_BASED_OUTPATIENT_CLINIC_OR_DEPARTMENT_OTHER): Payer: Self-pay | Admitting: Urology

## 2022-10-31 NOTE — Anesthesia Postprocedure Evaluation (Signed)
Anesthesia Post Note  Patient: Anthony Summers  Procedure(s) Performed: RADIOACTIVE SEED IMPLANT/BRACHYTHERAPY IMPLANT (Prostate) SPACE OAR INSTILLATION (Perineum)     Patient location during evaluation: PACU Anesthesia Type: General Level of consciousness: awake and alert Pain management: pain level controlled Vital Signs Assessment: post-procedure vital signs reviewed and stable Respiratory status: spontaneous breathing, nonlabored ventilation and respiratory function stable Cardiovascular status: blood pressure returned to baseline Postop Assessment: no apparent nausea or vomiting Anesthetic complications: no   No notable events documented.  Last Vitals:  Vitals:   10/30/22 1528 10/30/22 1605  BP: (!) 149/75 139/74  Pulse:  84  Resp:  16  Temp: 36.7 C 36.6 C  SpO2:  95%    Last Pain:  Vitals:   10/30/22 1605  TempSrc: Oral  PainSc: 0-No pain                 Marthenia Rolling

## 2022-11-09 ENCOUNTER — Telehealth (HOSPITAL_COMMUNITY): Payer: Self-pay | Admitting: *Deleted

## 2022-11-09 NOTE — Telephone Encounter (Signed)
Reaching out to patient to offer assistance regarding upcoming cardiac imaging study; pt verbalizes understanding of appt date/time, parking situation and where to check in, pre-test NPO status  and verified current allergies; name and call back number provided for further questions should they arise  Gordy Clement RN Navigator Cardiac Imaging [facility] [phone] office [phone] cell  Patient aware to avoid caffeine 12 hours prior to his cardiac PET test.

## 2022-11-13 ENCOUNTER — Ambulatory Visit (HOSPITAL_COMMUNITY)
Admission: RE | Admit: 2022-11-13 | Discharge: 2022-11-13 | Disposition: A | Discharge status: Home / Self Care | Payer: Medicare Other | Source: Ambulatory Visit | Attending: Internal Medicine | Admitting: Internal Medicine

## 2022-11-13 DIAGNOSIS — I700 Atherosclerosis of aorta: Secondary | ICD-10-CM | POA: Insufficient documentation

## 2022-11-13 DIAGNOSIS — I2510 Atherosclerotic heart disease of native coronary artery without angina pectoris: Secondary | ICD-10-CM | POA: Diagnosis present

## 2022-11-13 DIAGNOSIS — I2584 Coronary atherosclerosis due to calcified coronary lesion: Secondary | ICD-10-CM | POA: Diagnosis present

## 2022-11-13 LAB — NM PET CT CARDIAC PERFUSION MULTI W/ABSOLUTE BLOODFLOW
MBFR: 1.61
Nuc Rest EF: 79 %
Nuc Stress EF: 67 %
Rest MBF: 1.9 ml/g/min
Rest Nuclear Isotope Dose: 22.7 mCi
ST Depression (mm): 0 mm
Stress MBF: 3.06 ml/g/min
Stress Nuclear Isotope Dose: 22.7 mCi

## 2022-11-13 MED ORDER — REGADENOSON 0.4 MG/5ML IV SOLN: 0.4000 mg | Freq: Once | INTRAVENOUS | Status: AC

## 2022-11-13 MED ORDER — REGADENOSON 0.4 MG/5ML IV SOLN
INTRAVENOUS | Status: AC
  Administered 2022-11-13: 0.4 mg via INTRAVENOUS
  Filled 2022-11-13: qty 5

## 2022-11-13 MED ORDER — RUBIDIUM RB82 GENERATOR (RUBYFILL)
22.7000 | PACK | Freq: Once | INTRAVENOUS | Status: AC
  Administered 2022-11-13: 22.7 via INTRAVENOUS

## 2022-11-13 NOTE — Progress Notes (Signed)
NM PET CT Scan with Lexiscan complete and pt tolerated it well. VS and orders assessed and pt has no s/s of distress. Pt denies additional complaints. Soda offered with caffeine and pt tolerated it well. Will continue to monitor and tx pt according to MD orders.

## 2022-11-19 ENCOUNTER — Telehealth: Payer: Self-pay | Admitting: *Deleted

## 2022-11-19 ENCOUNTER — Telehealth: Payer: Self-pay

## 2022-11-19 NOTE — Telephone Encounter (Signed)
Mr. Kempner had some concerns about discomfort to bottom after recent seed implant and wanted to know if discomfort is normal.  Denies any rectal bleeding.  Described discomfort as on/off not a constant when sitting for too long and sometimes when standing.  States he has pain medication and will take as needed but wanted a end date on the discomfort.  Appreciative of the call and states if it becomes bothersome with pain medication will reach out.  Nothing else follows.

## 2022-11-19 NOTE — Telephone Encounter (Signed)
RETURNED PATIENT'S PHONE CALL, SPOKE WITH PATIENT. ?

## 2022-11-20 ENCOUNTER — Other Ambulatory Visit (HOSPITAL_COMMUNITY): Payer: Medicare Other

## 2022-11-20 ENCOUNTER — Telehealth: Payer: Self-pay | Admitting: *Deleted

## 2022-11-20 NOTE — Telephone Encounter (Signed)
Called patient to remind of post seed appts. for 11-22-22, spoke with patient and he is aware of these appts.

## 2022-11-21 ENCOUNTER — Encounter: Payer: Self-pay | Admitting: Internal Medicine

## 2022-11-21 ENCOUNTER — Ambulatory Visit (INDEPENDENT_AMBULATORY_CARE_PROVIDER_SITE_OTHER): Payer: Medicare Other | Admitting: Internal Medicine

## 2022-11-21 ENCOUNTER — Ambulatory Visit: Payer: Medicare Other | Attending: Internal Medicine

## 2022-11-21 VITALS — BP 122/60 | HR 94 | Ht 72.0 in | Wt 195.6 lb

## 2022-11-21 DIAGNOSIS — E782 Mixed hyperlipidemia: Secondary | ICD-10-CM | POA: Insufficient documentation

## 2022-11-21 DIAGNOSIS — I714 Abdominal aortic aneurysm, without rupture, unspecified: Secondary | ICD-10-CM | POA: Insufficient documentation

## 2022-11-21 DIAGNOSIS — I10 Essential (primary) hypertension: Secondary | ICD-10-CM

## 2022-11-21 DIAGNOSIS — J439 Emphysema, unspecified: Secondary | ICD-10-CM

## 2022-11-21 DIAGNOSIS — F17200 Nicotine dependence, unspecified, uncomplicated: Secondary | ICD-10-CM | POA: Insufficient documentation

## 2022-11-21 DIAGNOSIS — I2510 Atherosclerotic heart disease of native coronary artery without angina pectoris: Secondary | ICD-10-CM | POA: Insufficient documentation

## 2022-11-21 DIAGNOSIS — I700 Atherosclerosis of aorta: Secondary | ICD-10-CM | POA: Diagnosis present

## 2022-11-21 DIAGNOSIS — I2584 Coronary atherosclerosis due to calcified coronary lesion: Secondary | ICD-10-CM | POA: Diagnosis present
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
## 2022-11-21 NOTE — Patient Instructions (Addendum)
Medication Instructions:  STOP Losartan *If you need a refill on your cardiac medications before your next appointment, please call your pharmacy*   Lab Work: Lipids, ALT today BMET in 2 weeks If you have labs (blood work) drawn today and your tests are completely normal, you will receive your results only by: [place] (if you have MyChart) OR A paper copy in the mail If you have any lab test that is abnormal or we need to change your treatment, we will call you to review the results.   Testing/Procedures: **Monitor BP at home and report readings to [place] in a few weeks**   Follow-Up: At [facility], you and your health needs are our priority.  As part of our continuing mission to provide you with exceptional heart care, we have created designated Provider Care Teams.  These Care Teams include your primary Cardiologist (physician) and Advanced Practice Providers (APPs -  Physician Assistants and Nurse Practitioners) who all work together to provide you with the care you need, when you need it.  We recommend signing up for the patient portal called "MyChart".  Sign up information is provided on this After Visit Summary.  MyChart is used to connect with patients for Virtual Visits (Telemedicine).  Patients are able to view lab/test results, encounter notes, upcoming appointments, etc.  Non-urgent messages can be sent to your provider as well.   To learn more about what you can do with MyChart, go to [email or web address].    Your next appointment:   6 month(s)  The format for your next appointment:   In Person  Provider:   Nicholes Rough, PA-C, Melina Copa, PA-C, Ambrose Pancoast, NP, Ermalinda Barrios, PA-C, Christen Bame, NP, or Richardson Dopp, PA-C     Then, Werner Lean, MD will plan to see you again in 1 year(s).      Important Information About Sugar

## 2022-11-22 ENCOUNTER — Encounter: Payer: Self-pay | Admitting: Urology

## 2022-11-22 ENCOUNTER — Other Ambulatory Visit: Payer: Self-pay | Admitting: Urology

## 2022-11-22 ENCOUNTER — Ambulatory Visit
Admission: RE | Admit: 2022-11-22 | Discharge: 2022-11-22 | Disposition: A | Discharge status: Home / Self Care | Payer: Medicare Other | Source: Ambulatory Visit | Attending: Radiation Oncology | Admitting: Radiation Oncology

## 2022-11-22 ENCOUNTER — Ambulatory Visit
Admission: RE | Admit: 2022-11-22 | Discharge: 2022-11-22 | Disposition: A | Discharge status: Home / Self Care | Payer: Medicare Other | Source: Ambulatory Visit | Attending: Urology | Admitting: Urology

## 2022-11-22 ENCOUNTER — Other Ambulatory Visit: Payer: Self-pay

## 2022-11-22 VITALS — BP 134/66 | HR 99 | Temp 97.9°F | Resp 20 | Ht 72.0 in | Wt 195.0 lb

## 2022-11-22 DIAGNOSIS — Z510 Encounter for antineoplastic radiation therapy: Secondary | ICD-10-CM | POA: Diagnosis not present

## 2022-11-22 DIAGNOSIS — C61 Malignant neoplasm of prostate: Secondary | ICD-10-CM | POA: Insufficient documentation

## 2022-11-22 DIAGNOSIS — Z923 Personal history of irradiation: Secondary | ICD-10-CM | POA: Insufficient documentation

## 2022-11-22 DIAGNOSIS — Z79899 Other long term (current) drug therapy: Secondary | ICD-10-CM | POA: Insufficient documentation

## 2022-11-22 DIAGNOSIS — Z7982 Long term (current) use of aspirin: Secondary | ICD-10-CM | POA: Insufficient documentation

## 2022-11-22 LAB — LIPID PANEL
Chol/HDL Ratio: 1.9 ratio (ref 0.0–5.0)
Cholesterol, Total: 137 mg/dL (ref 100–199)
HDL: 74 mg/dL (ref >39)
LDL Chol Calc (NIH): 46 mg/dL (ref 0–99)
Triglycerides: 90 mg/dL (ref 0–149)
VLDL Cholesterol Cal: 17 mg/dL (ref 5–40)

## 2022-11-22 LAB — ALT: ALT: 11 IU/L (ref 0–44)

## 2022-11-22 MED ORDER — OXYCODONE-ACETAMINOPHEN 5-325 MG PO TABS: 1.0000 | ORAL_TABLET | ORAL | 0 refills | Status: DC | PRN

## 2022-11-22 NOTE — Progress Notes (Signed)
Post-seed nursing interview for malignant neoplasm of the prostate. I verified patient's identity and began nursing interview.  Patient reports dysuria 2/10, constipation w/ pain 7/10. No other issues reported at this time.  Meaningful use complete. Tamsulosin as directed. I-PSS score of 12-moderate. Urology appt- 11/29/2022 w/ Dr. Tresa Moore at [facility].  BP 134/66 (BP Location: Right Arm, Patient Position: Sitting, Cuff Size: Large)   Pulse 99   Temp 97.9 F (36.6 C)   Resp 20   Ht 6' (1.829 m)   Wt 195 lb (88.5 kg)   SpO2 99%   BMI 26.45 kg/m    This concludes the interview.   Leandra Kern, LPN

## 2022-11-22 NOTE — Progress Notes (Signed)
Radiation Oncology         [phone] ________________________________  Name: Anthony Summers MRN: [id number]  Date: 11/22/2022  DOB: Jul 05, 1956  Post-Seed Follow-Up Visit Note  CC: Janie Morning, DO  Remi Haggard, MD  Diagnosis:   66 y.o. gentleman with Stage T1c adenocarcinoma of the prostate with Gleason score of 4+3, and PSA of 10.     ICD-10-CM   1. Malignant neoplasm of prostate ([place])  C61       Interval Since Last Radiation:  3 weeks 10/30/22:  Insertion of radioactive I-125 seeds into the prostate gland; 145 Gy, definitive therapy with placement of SpaceOAR gel.  Narrative:  The patient returns today for routine follow-up.  He is complaining of increased urinary frequency and urinary hesitation symptoms. He filled out a questionnaire regarding urinary function today providing and overall IPSS score of 12 characterizing his symptoms as mild-moderate.  His pre-implant score was 9. He denies any dysuria, gross hematuria, flank pain, fever,chills or night sweats. He has had significant rectal discomfort, worse with sitting for any period of time. He denies hematochezia or straining to pass BMs but has had some constipation with only small volume, firm stools. He has recently started taking Miralax with some relief of the constipation. He tried Tramadol for the rectal discomfort but this was not helpful so more recently, he has been taking Tylenol prn with only minimal improvement.  ALLERGIES:  has No Known Allergies.  Meds: Current Outpatient Medications  Medication Sig Dispense Refill   albuterol (VENTOLIN HFA) 108 (90 Base) MCG/ACT inhaler Inhale 1-2 puffs into the lungs every 6 (six) hours as needed for shortness of breath or wheezing.     ALPRAZolam (XANAX) 0.5 MG tablet Take 0.5 mg by mouth as needed for anxiety or sleep.     aspirin EC 81 MG tablet Take 81 mg by mouth daily. Swallow whole.     Cholecalciferol (VITAMIN D) 125 MCG (5000 UT) CAPS Take 5,000 Units by  mouth daily.     ezetimibe (ZETIA) 10 MG tablet Take 10 mg by mouth daily.     fluticasone (FLONASE) 50 MCG/ACT nasal spray Place 1-2 sprays into both nostrils daily as needed for allergies.     furosemide (LASIX) 20 MG tablet Take 20 mg by mouth daily.     Multiple Vitamins-Minerals (PRESERVISION AREDS 2 PO) Take 1 capsule by mouth in the morning and at bedtime.     naproxen sodium (ALEVE) 220 MG tablet Take 220 mg by mouth daily as needed (pain).     rosuvastatin (CRESTOR) 10 MG tablet Take 1 tablet (10 mg total) by mouth daily. 30 tablet 11   tamsulosin (FLOMAX) 0.4 MG CAPS capsule Take 1 capsule (0.4 mg total) by mouth daily after supper. 30 capsule 6   traMADol (ULTRAM) 50 MG tablet Take 1 tablet (50 mg total) by mouth every 6 (six) hours as needed. 20 tablet 0   No current facility-administered medications for this visit.    Physical Findings: In general this is a well appearing Caucasian male in no acute distress. He's alert and oriented x4 and appropriate throughout the examination. Cardiopulmonary assessment is negative for acute distress and he exhibits normal effort.   Lab Findings: Lab Results  Component Value Date   WBC 12.3 (H) 01/25/2022   HGB 15.3 10/30/2022   HCT 45.0 10/30/2022   MCV 101.0 (H) 01/25/2022   PLT 137 (L) 01/25/2022    Radiographic Findings:  Patient underwent CT imaging in  our clinic for post implant dosimetry. The CT will be reviewed by Dr. Tammi Klippel to confirm there is an adequate distribution of radioactive seeds throughout the prostate gland and ensure that there are no seeds in or near the rectum.  We suspect the final radiation plan and dosimetry will show appropriate coverage of the prostate gland. He understands that we will call and inform him of any unexpected findings on further review of his imaging and dosimetry.  Impression/Plan: 66 y.o. gentleman with Stage T1c adenocarcinoma of the prostate with Gleason score of 4+3, and PSA of 10.  The  patient is recovering from the effects of radiation. His urinary symptoms should gradually improve over the next 4-6 months. We talked about this today and he is encouraged by his improvement already. We discussed continuing the Miralax daily until bowels become more regular and particularly while using pain medications. I have offered to send a Rx for Percocet to help manage his pain and he is in favor of trying this so I will send a Rx. I did explain that the pressure/discomfort is likely a combination of proctitis/prostatitis associated with the procedure itself as well as the cumulative dose of radiation. I anticipate the rectal discomfort to subside over the next 2-3 weeks and he is otherwise pleased with his outcome. We also talked about long-term follow-up for prostate cancer following seed implant. He understands that ongoing PSA determinations and digital rectal exams will help perform surveillance to rule out disease recurrence. He has a follow up appointment scheduled with Dr. Milford Cage on 11/29/22. He understands what to expect with his PSA measures. Patient was also educated today about some of the long-term effects from radiation including a small risk for rectal bleeding and possibly erectile dysfunction. We talked about some of the general management approaches to these potential complications. However, I did encourage the patient to contact our office or return at any point if he has questions or concerns related to his previous radiation and prostate cancer.    Nicholos Johns, PA-C

## 2022-11-22 NOTE — Progress Notes (Signed)
  Radiation Oncology         [phone] ________________________________  Name: Anthony Summers MRN: [id number]  Date: 11/22/2022  DOB: 1956/08/03  COMPLEX SIMULATION NOTE  NARRATIVE:  The patient was brought to the [facility] today following prostate seed implantation approximately one month ago.  Identity was confirmed.  All relevant records and images related to the planned course of therapy were reviewed.  Then, the patient was set-up supine.  CT images were obtained.  The CT images were loaded into the planning software.  Then the prostate and rectum were contoured.  Treatment planning then occurred.  The implanted iodine 125 seeds were identified by the physics staff for projection of radiation distribution  I have requested : 3D Simulation  I have requested a DVH of the following structures: Prostate and rectum.    ________________________________  Sheral Apley Tammi Klippel, M.D.

## 2022-11-26 ENCOUNTER — Ambulatory Visit
Admission: RE | Admit: 2022-11-26 | Discharge: 2022-11-26 | Disposition: A | Discharge status: Home / Self Care | Payer: Medicare Other | Source: Ambulatory Visit | Attending: Radiation Oncology | Admitting: Radiation Oncology

## 2022-11-26 DIAGNOSIS — C61 Malignant neoplasm of prostate: Secondary | ICD-10-CM | POA: Diagnosis not present

## 2022-11-26 DIAGNOSIS — Z510 Encounter for antineoplastic radiation therapy: Secondary | ICD-10-CM | POA: Insufficient documentation

## 2022-11-27 ENCOUNTER — Encounter: Payer: Self-pay | Admitting: Radiation Oncology

## 2022-11-27 NOTE — Progress Notes (Addendum)
  Radiation Oncology         [phone] ________________________________  Name: [name] MRN: [id number]  Date: 11/26/2022  DOB: 1956/03/29  3D Planning Note   Prostate Brachytherapy Post-Implant Dosimetry  Diagnosis:  66 y.o. gentleman with Stage T1c adenocarcinoma of the prostate with Gleason score of 4+3, and PSA of 10.  Narrative: On a previous date, Anthony Summers returned following prostate seed implantation for post implant planning. He underwent CT scan complex simulation to delineate the three-dimensional structures of the pelvis and demonstrate the radiation distribution.  Since that time, the seed localization, and complex isodose planning with dose volume histograms have now been completed.  Results:   Prostate Coverage - The dose of radiation delivered to the 90% or more of the prostate gland (D90) was 102.05% of the prescription dose. This exceeds our goal of greater than 90%. Rectal Sparing - The volume of rectal tissue receiving the prescription dose or higher was 0.0 cc. This falls under our thresholds tolerance of 1.0 cc.  Impression: The prostate seed implant appears to show adequate target coverage and appropriate rectal sparing.  Plan:  The patient will continue to follow with urology for ongoing PSA determinations. I would anticipate a high likelihood for local tumor control with minimal risk for rectal morbidity.  ________________________________  Sheral Apley Tammi Klippel, M.D.

## 2022-12-11 ENCOUNTER — Ambulatory Visit: Payer: Medicare Other | Attending: Internal Medicine

## 2022-12-11 DIAGNOSIS — I10 Essential (primary) hypertension: Secondary | ICD-10-CM

## 2022-12-11 DIAGNOSIS — I700 Atherosclerosis of aorta: Secondary | ICD-10-CM

## 2022-12-11 DIAGNOSIS — F17200 Nicotine dependence, unspecified, uncomplicated: Secondary | ICD-10-CM

## 2022-12-11 DIAGNOSIS — I2510 Atherosclerotic heart disease of native coronary artery without angina pectoris: Secondary | ICD-10-CM

## 2022-12-11 DIAGNOSIS — I714 Abdominal aortic aneurysm, without rupture, unspecified: Secondary | ICD-10-CM

## 2022-12-11 DIAGNOSIS — J439 Emphysema, unspecified: Secondary | ICD-10-CM

## 2022-12-12 LAB — BASIC METABOLIC PANEL
BUN/Creatinine Ratio: 13 (ref 10–24)
BUN: 10 mg/dL (ref 8–27)
CO2: 25 mmol/L (ref 20–29)
Calcium: 9.1 mg/dL (ref 8.6–10.2)
Chloride: 96 mmol/L (ref 96–106)
Creatinine, Ser: 0.76 mg/dL (ref 0.76–1.27)
Glucose: 106 mg/dL — ABNORMAL HIGH (ref 70–99)
Potassium: 3.9 mmol/L (ref 3.5–5.2)
Sodium: 138 mmol/L (ref 134–144)
eGFR: 99 mL/min/{1.73_m2} (ref >59)

## 2023-01-11 ENCOUNTER — Other Ambulatory Visit: Payer: Self-pay | Admitting: *Deleted

## 2023-01-11 DIAGNOSIS — I714 Abdominal aortic aneurysm, without rupture, unspecified: Secondary | ICD-10-CM

## 2023-01-11 DIAGNOSIS — M79606 Pain in leg, unspecified: Secondary | ICD-10-CM

## 2023-01-14 ENCOUNTER — Ambulatory Visit (INDEPENDENT_AMBULATORY_CARE_PROVIDER_SITE_OTHER): Payer: Medicare Other | Admitting: Physician Assistant

## 2023-01-14 ENCOUNTER — Encounter: Payer: Self-pay | Admitting: Physician Assistant

## 2023-01-14 ENCOUNTER — Ambulatory Visit (INDEPENDENT_AMBULATORY_CARE_PROVIDER_SITE_OTHER): Payer: Medicare Other

## 2023-01-14 DIAGNOSIS — M25551 Pain in right hip: Secondary | ICD-10-CM

## 2023-01-14 DIAGNOSIS — M7061 Trochanteric bursitis, right hip: Secondary | ICD-10-CM | POA: Diagnosis not present

## 2023-01-14 MED ORDER — METHYLPREDNISOLONE ACETATE 40 MG/ML IJ SUSP
80.0000 mg | INTRAMUSCULAR | Status: AC | PRN
  Administered 2023-01-14: 80 mg via INTRA_ARTICULAR

## 2023-01-14 MED ORDER — BUPIVACAINE HCL 0.25 % IJ SOLN
2.0000 mL | INTRAMUSCULAR | Status: AC | PRN
  Administered 2023-01-14: 2 mL via INTRA_ARTICULAR

## 2023-01-14 MED ORDER — LIDOCAINE HCL 1 % IJ SOLN
2.0000 mL | INTRAMUSCULAR | Status: AC | PRN
  Administered 2023-01-14: 2 mL
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
## 2023-01-17 ENCOUNTER — Ambulatory Visit (INDEPENDENT_AMBULATORY_CARE_PROVIDER_SITE_OTHER)
Admission: RE | Admit: 2023-01-17 | Discharge: 2023-01-17 | Disposition: A | Discharge status: Home / Self Care | Payer: Medicare Other | Source: Ambulatory Visit | Attending: Vascular Surgery | Admitting: Vascular Surgery

## 2023-01-17 ENCOUNTER — Ambulatory Visit (HOSPITAL_COMMUNITY)
Admission: RE | Admit: 2023-01-17 | Discharge: 2023-01-17 | Disposition: A | Discharge status: Home / Self Care | Payer: Medicare Other | Source: Ambulatory Visit | Attending: Vascular Surgery | Admitting: Vascular Surgery

## 2023-01-17 ENCOUNTER — Ambulatory Visit (INDEPENDENT_AMBULATORY_CARE_PROVIDER_SITE_OTHER): Payer: Medicare Other | Admitting: Physician Assistant

## 2023-01-17 VITALS — BP 148/79 | HR 80 | Temp 98.0°F | Ht 72.0 in | Wt 190.8 lb

## 2023-01-17 DIAGNOSIS — M79606 Pain in leg, unspecified: Secondary | ICD-10-CM

## 2023-01-17 DIAGNOSIS — I714 Abdominal aortic aneurysm, without rupture, unspecified: Secondary | ICD-10-CM

## 2023-01-17 LAB — VAS US ABI WITH/WO TBI
Left ABI: 1.02
Right ABI: 0.98

## 2023-01-22 ENCOUNTER — Encounter: Payer: Self-pay | Admitting: *Deleted

## 2023-01-23 ENCOUNTER — Other Ambulatory Visit: Payer: Self-pay

## 2023-01-23 ENCOUNTER — Ambulatory Visit (INDEPENDENT_AMBULATORY_CARE_PROVIDER_SITE_OTHER): Payer: Medicare Other | Admitting: Physician Assistant

## 2023-01-23 ENCOUNTER — Ambulatory Visit: Payer: Self-pay

## 2023-01-23 ENCOUNTER — Encounter: Payer: Self-pay | Admitting: Physician Assistant

## 2023-01-23 DIAGNOSIS — M5441 Lumbago with sciatica, right side: Secondary | ICD-10-CM

## 2023-01-23 DIAGNOSIS — I714 Abdominal aortic aneurysm, without rupture, unspecified: Secondary | ICD-10-CM
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
## 2023-01-28 ENCOUNTER — Inpatient Hospital Stay: Payer: Medicare Other | Attending: Adult Health | Admitting: *Deleted

## 2023-01-28 DIAGNOSIS — C61 Malignant neoplasm of prostate: Secondary | ICD-10-CM

## 2023-02-04 ENCOUNTER — Encounter: Payer: Self-pay | Admitting: Physical Therapy

## 2023-02-04 ENCOUNTER — Other Ambulatory Visit: Payer: Self-pay

## 2023-02-04 ENCOUNTER — Ambulatory Visit (INDEPENDENT_AMBULATORY_CARE_PROVIDER_SITE_OTHER): Payer: Medicare Other | Admitting: Physical Therapy

## 2023-02-04 DIAGNOSIS — M5441 Lumbago with sciatica, right side: Secondary | ICD-10-CM | POA: Diagnosis not present

## 2023-02-04 DIAGNOSIS — M6281 Muscle weakness (generalized): Secondary | ICD-10-CM | POA: Diagnosis not present

## 2023-02-04 DIAGNOSIS — G8929 Other chronic pain: Secondary | ICD-10-CM

## 2023-02-04 DIAGNOSIS — M5459 Other low back pain: Secondary | ICD-10-CM

## 2023-02-04 DIAGNOSIS — R262 Difficulty in walking, not elsewhere classified: Secondary | ICD-10-CM
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
## 2023-02-05 ENCOUNTER — Ambulatory Visit (INDEPENDENT_AMBULATORY_CARE_PROVIDER_SITE_OTHER): Payer: Medicare Other | Admitting: Physical Therapy

## 2023-02-05 ENCOUNTER — Encounter: Payer: Self-pay | Admitting: Physical Therapy

## 2023-02-05 DIAGNOSIS — M5441 Lumbago with sciatica, right side: Secondary | ICD-10-CM | POA: Diagnosis not present

## 2023-02-05 DIAGNOSIS — G8929 Other chronic pain: Secondary | ICD-10-CM

## 2023-02-05 DIAGNOSIS — M6281 Muscle weakness (generalized): Secondary | ICD-10-CM

## 2023-02-05 DIAGNOSIS — M5459 Other low back pain: Secondary | ICD-10-CM

## 2023-02-05 DIAGNOSIS — R262 Difficulty in walking, not elsewhere classified: Secondary | ICD-10-CM | POA: Diagnosis not present
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
## 2023-02-11 ENCOUNTER — Ambulatory Visit (INDEPENDENT_AMBULATORY_CARE_PROVIDER_SITE_OTHER): Payer: Medicare Other | Admitting: Physical Therapy

## 2023-02-11 ENCOUNTER — Encounter: Payer: Self-pay | Admitting: Physical Therapy

## 2023-02-11 DIAGNOSIS — M5441 Lumbago with sciatica, right side: Secondary | ICD-10-CM | POA: Diagnosis not present

## 2023-02-11 DIAGNOSIS — M6281 Muscle weakness (generalized): Secondary | ICD-10-CM

## 2023-02-11 DIAGNOSIS — M5459 Other low back pain: Secondary | ICD-10-CM

## 2023-02-11 DIAGNOSIS — G8929 Other chronic pain: Secondary | ICD-10-CM

## 2023-02-11 DIAGNOSIS — R262 Difficulty in walking, not elsewhere classified: Secondary | ICD-10-CM | POA: Diagnosis not present
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
## 2023-02-13 ENCOUNTER — Encounter: Payer: Medicare Other | Admitting: Physical Therapy

## 2023-02-18 ENCOUNTER — Encounter: Payer: Medicare Other | Admitting: Physical Therapy

## 2023-02-19 ENCOUNTER — Other Ambulatory Visit: Payer: Self-pay

## 2023-02-19 ENCOUNTER — Telehealth: Payer: Self-pay | Admitting: Physician Assistant

## 2023-02-19 DIAGNOSIS — M5441 Lumbago with sciatica, right side: Secondary | ICD-10-CM

## 2023-02-19 NOTE — Telephone Encounter (Signed)
Please go ahead an schedule MRI for patient.Marland Kitchen

## 2023-02-19 NOTE — Telephone Encounter (Signed)
Order for scan submitted per last OV note.

## 2023-02-20 ENCOUNTER — Encounter: Payer: Self-pay | Admitting: Physical Therapy

## 2023-02-20 ENCOUNTER — Ambulatory Visit (INDEPENDENT_AMBULATORY_CARE_PROVIDER_SITE_OTHER): Payer: Medicare Other | Admitting: Physical Therapy

## 2023-02-20 DIAGNOSIS — M6281 Muscle weakness (generalized): Secondary | ICD-10-CM | POA: Diagnosis not present

## 2023-02-20 DIAGNOSIS — R262 Difficulty in walking, not elsewhere classified: Secondary | ICD-10-CM

## 2023-02-20 DIAGNOSIS — M5441 Lumbago with sciatica, right side: Secondary | ICD-10-CM

## 2023-02-20 DIAGNOSIS — M5459 Other low back pain: Secondary | ICD-10-CM

## 2023-02-20 DIAGNOSIS — G8929 Other chronic pain: Secondary | ICD-10-CM
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
## 2023-02-21 ENCOUNTER — Ambulatory Visit: Payer: Medicare Other | Admitting: Internal Medicine

## 2023-03-05 ENCOUNTER — Other Ambulatory Visit: Payer: Self-pay | Admitting: Physician Assistant

## 2023-03-06 ENCOUNTER — Ambulatory Visit
Admission: RE | Admit: 2023-03-06 | Discharge: 2023-03-06 | Disposition: A | Discharge status: Home / Self Care | Payer: Medicare Other | Source: Ambulatory Visit | Attending: Physician Assistant | Admitting: Physician Assistant

## 2023-03-06 DIAGNOSIS — M5441 Lumbago with sciatica, right side: Secondary | ICD-10-CM

## 2023-03-08 ENCOUNTER — Ambulatory Visit (INDEPENDENT_AMBULATORY_CARE_PROVIDER_SITE_OTHER): Payer: Medicare Other | Admitting: Physician Assistant

## 2023-03-08 ENCOUNTER — Encounter: Payer: Self-pay | Admitting: Physician Assistant

## 2023-03-08 DIAGNOSIS — M5440 Lumbago with sciatica, unspecified side: Secondary | ICD-10-CM
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
## 2023-04-09 ENCOUNTER — Telehealth: Payer: Self-pay | Admitting: Physician Assistant

## 2023-04-09 DIAGNOSIS — M5440 Lumbago with sciatica, unspecified side: Secondary | ICD-10-CM

## 2023-04-09 NOTE — Telephone Encounter (Signed)
Called and advised pt.

## 2023-04-09 NOTE — Telephone Encounter (Signed)
Referral placed in chart  

## 2023-04-09 NOTE — Telephone Encounter (Signed)
Patient advising he is ready for a Cortisone injection in his back. Please advise

## 2023-04-10 ENCOUNTER — Telehealth: Payer: Self-pay | Admitting: Physician Assistant

## 2023-04-10 NOTE — Telephone Encounter (Signed)
Spoke with patient and scheduled OV for 04/11/23.

## 2023-04-10 NOTE — Telephone Encounter (Signed)
Patient called. Says he is having a lot of pain. Would like to know how much longer on the gel injection? Having trouble sleeping and walking. [phone]

## 2023-04-10 NOTE — Telephone Encounter (Signed)
I believe he is talking about the back injection

## 2023-04-11 ENCOUNTER — Ambulatory Visit (INDEPENDENT_AMBULATORY_CARE_PROVIDER_SITE_OTHER): Payer: Medicare Other | Admitting: Physical Medicine and Rehabilitation

## 2023-04-11 ENCOUNTER — Encounter: Payer: Self-pay | Admitting: Physical Medicine and Rehabilitation

## 2023-04-11 DIAGNOSIS — M5416 Radiculopathy, lumbar region: Secondary | ICD-10-CM

## 2023-04-11 DIAGNOSIS — M5116 Intervertebral disc disorders with radiculopathy, lumbar region: Secondary | ICD-10-CM | POA: Diagnosis not present

## 2023-04-11 DIAGNOSIS — G8929 Other chronic pain: Secondary | ICD-10-CM | POA: Diagnosis not present

## 2023-04-11 MED ORDER — ACETAMINOPHEN-CODEINE 300-30 MG PO TABS: 1.0000 | ORAL_TABLET | Freq: Three times a day (TID) | ORAL | 0 refills | Status: DC | PRN

## 2023-04-11 NOTE — Progress Notes (Signed)
Functional Pain Scale - descriptive words and definitions  Distressing (6)    Pain is present/unable to complete most ADLs limited by pain/sleep is difficult and active distraction is only marginal. Moderate range order  Average Pain  varies  Lower back pain on right side that radiates into right leg from buttocks and down the leg to the ankle. Has tried pain medication and therapeutic massage
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
## 2023-04-24 ENCOUNTER — Ambulatory Visit (INDEPENDENT_AMBULATORY_CARE_PROVIDER_SITE_OTHER): Payer: Medicare Other | Admitting: Physical Medicine and Rehabilitation

## 2023-04-24 ENCOUNTER — Other Ambulatory Visit: Payer: Self-pay

## 2023-04-24 DIAGNOSIS — M5416 Radiculopathy, lumbar region: Secondary | ICD-10-CM | POA: Diagnosis not present

## 2023-04-24 MED ORDER — METHYLPREDNISOLONE ACETATE 80 MG/ML IJ SUSP
80.0000 mg | Freq: Once | INTRAMUSCULAR | Status: AC
Start: 2023-04-24 — End: 2023-04-24
  Administered 2023-04-24: 80 mg

## 2023-04-24 NOTE — Procedures (Signed)
Lumbosacral Transforaminal Epidural Steroid Injection - Sub-Pedicular Approach with Fluoroscopic Guidance  Patient: Anthony Summers      Date of Birth: 1956-08-29 MRN: [id number] PCP: Irena Reichmann, DO      Visit Date: 04/24/2023   Universal Protocol:    Date/Time: 04/24/2023  Consent Given By: the patient  Position: PRONE  Additional Comments: Vital signs were monitored before and after the procedure. Patient was prepped and draped in the usual sterile fashion. The correct patient, procedure, and site was verified.   Injection Procedure Details:   Procedure diagnoses: Lumbar radiculopathy [M54.16]    Meds Administered:  Meds ordered this encounter  Medications   methylPREDNISolone acetate (DEPO-MEDROL) injection 80 mg    Laterality: Right  Location/Site: L4  Needle:5.0 in., 22 ga.  Short bevel or Quincke spinal needle  Needle Placement: Transforaminal  Findings:    -Comments: Excellent flow of contrast along the nerve, nerve root and into the epidural space.  Procedure Details: After squaring off the end-plates to get a true AP view, the C-arm was positioned so that an oblique view of the foramen as noted above was visualized. The target area is just inferior to the "nose of the scotty dog" or sub pedicular. The soft tissues overlying this structure were infiltrated with 2-3 ml. of 1% Lidocaine without Epinephrine.  The spinal needle was inserted toward the target using a "trajectory" view along the fluoroscope beam.  Under AP and lateral visualization, the needle was advanced so it did not puncture dura and was located close the 6 O'Clock position of the pedical in AP tracterory. Biplanar projections were used to confirm position. Aspiration was confirmed to be negative for CSF and/or blood. A 1-2 ml. volume of Isovue-250 was injected and flow of contrast was noted at each level. Radiographs were obtained for documentation purposes.   After attaining the desired flow  of contrast documented above, a 0.5 to 1.0 ml test dose of 0.25% Marcaine was injected into each respective transforaminal space.  The patient was observed for 90 seconds post injection.  After no sensory deficits were reported, and normal lower extremity motor function was noted,   the above injectate was administered so that equal amounts of the injectate were placed at each foramen (level) into the transforaminal epidural space.   Additional Comments:  The patient tolerated the procedure well Dressing: 2 x 2 sterile gauze and Band-Aid    Post-procedure details: Patient was observed during the procedure. Post-procedure instructions were reviewed.  Patient left the clinic in stable condition.

## 2023-04-24 NOTE — Progress Notes (Signed)
Functional Pain Scale - descriptive words and definitions  Uncomfortable (3)  Pain is present but can complete all ADL's/sleep is slightly affected and passive distraction only gives marginal relief. Mild range order  Average Pain  varies   +Driver, -BT, -Dye Allergies.  Lower back pain on right side with pain in the lower right leg

## 2023-04-24 NOTE — Patient Instructions (Signed)

## 2023-04-24 NOTE — Progress Notes (Signed)
Anthony Summers - 67 y.o. male MRN [id number]  Date of birth: 06/08/1956  Office Visit Note: Visit Date: 04/24/2023 PCP: Irena Reichmann, DO Referred by: Irena Reichmann, DO  Subjective: Chief Complaint  Patient presents with   Lower Back - Pain   HPI:  Anthony Summers is a 67 y.o. male who comes in today at the request of Ellin Goodie, FNP for planned Right L5-S1 Lumbar Transforaminal epidural steroid injection with fluoroscopic guidance.  The patient has failed conservative care including home exercise, medications, time and activity modification.  This injection will be diagnostic and hopefully therapeutic.  Please see requesting physician notes for further details and justification.  I elected to do the injection at L4 where there was foraminal disc protrusion as he was not having much radicular pain.   ROS Otherwise per HPI.  Assessment & Plan: Visit Diagnoses:    ICD-10-CM   1. Lumbar radiculopathy  M54.16 XR C-ARM NO REPORT    Epidural Steroid injection    methylPREDNISolone acetate (DEPO-MEDROL) injection 80 mg      Plan: No additional findings.   Meds & Orders:  Meds ordered this encounter  Medications   methylPREDNISolone acetate (DEPO-MEDROL) injection 80 mg    Orders Placed This Encounter  Procedures   XR C-ARM NO REPORT   Epidural Steroid injection    Follow-up: No follow-ups on file.   Procedures: No procedures performed      Clinical History: CLINICAL DATA:  Low back pain and radiculopathy.   EXAM: MRI LUMBAR SPINE WITHOUT CONTRAST   TECHNIQUE: Multiplanar, multisequence MR imaging of the lumbar spine was performed. No intravenous contrast was administered.   COMPARISON:  Two views lumbar spine 01/23/2023. CTA abdomen and pelvis 03/14/2022.   FINDINGS: Segmentation:  Standard.   Alignment:  Maintained.   Vertebrae: No fracture, evidence of discitis, or bone lesion. Scattered degenerative endplate signal change is most notable L5-S1    Conus medullaris and cauda equina: Conus extends to the L1-2 level. Conus and cauda equina appear normal.   Paraspinal and other soft tissues: 2 small right renal cysts are unchanged.   Disc levels:   T11-12 is imaged in the sagittal plane only. There is a shallow disc bulge to the right but no stenosis.   T12-L1: Negative.   L1-2: Minimal disc bulge without stenosis.   L2-3: Shallow disc bulge without stenosis.   L3-4: Shallow disc bulge. The central canal is open. Mild foraminal narrowing is more notable on the left.   L4-5: Shallow disc bulge with a superimposed right foraminal protrusion. There is severe right foraminal stenosis with encroachment on the exiting right L4 root. The central canal and left foramen are open.   L5-S1: Shallow disc bulge without stenosis.   IMPRESSION: 1. Right foraminal protrusion at L4-5 causes severe right foraminal stenosis and encroachment on the exiting right L4 root. 2. Mild foraminal narrowing at L3-4 is more notable on the left.     Electronically Signed   By: Drusilla Kanner M.D.   On: 03/08/2023 10:18     Objective:  VS:  HT:    WT:   BMI:     BP:   HR: bpm  TEMP: ( )  RESP:  Physical Exam Vitals and nursing note reviewed.  Constitutional:      General: He is not in acute distress.    Appearance: Normal appearance. He is not ill-appearing.  HENT:     Head: Normocephalic and atraumatic.  Right Ear: External ear normal.     Left Ear: External ear normal.     Nose: No congestion.  Eyes:     Extraocular Movements: Extraocular movements intact.  Cardiovascular:     Rate and Rhythm: Normal rate.     Pulses: Normal pulses.  Pulmonary:     Effort: Pulmonary effort is normal. No respiratory distress.  Abdominal:     General: There is no distension.     Palpations: Abdomen is soft.  Musculoskeletal:        General: No tenderness or signs of injury.     Cervical back: Neck supple.     Right lower leg: No edema.      Left lower leg: No edema.     Comments: Patient has good distal strength without clonus.  Skin:    Findings: No erythema or rash.  Neurological:     General: No focal deficit present.     Mental Status: He is alert and oriented to person, place, and time.     Sensory: No sensory deficit.     Motor: No weakness or abnormal muscle tone.     Coordination: Coordination normal.  Psychiatric:        Mood and Affect: Mood normal.        Behavior: Behavior normal.      Imaging: No results found.

## 2023-05-07 ENCOUNTER — Other Ambulatory Visit: Payer: Self-pay | Admitting: Vascular Surgery

## 2023-06-08 IMAGING — DX DG CHEST 1V PORT
1 series · 1 of 1 positions shown · non-contrast
Comparison: 06/14/2014

CLINICAL DATA: Postop abdominal aortic stent placement.

EXAM:
PORTABLE CHEST 1 VIEW

[chest ap]
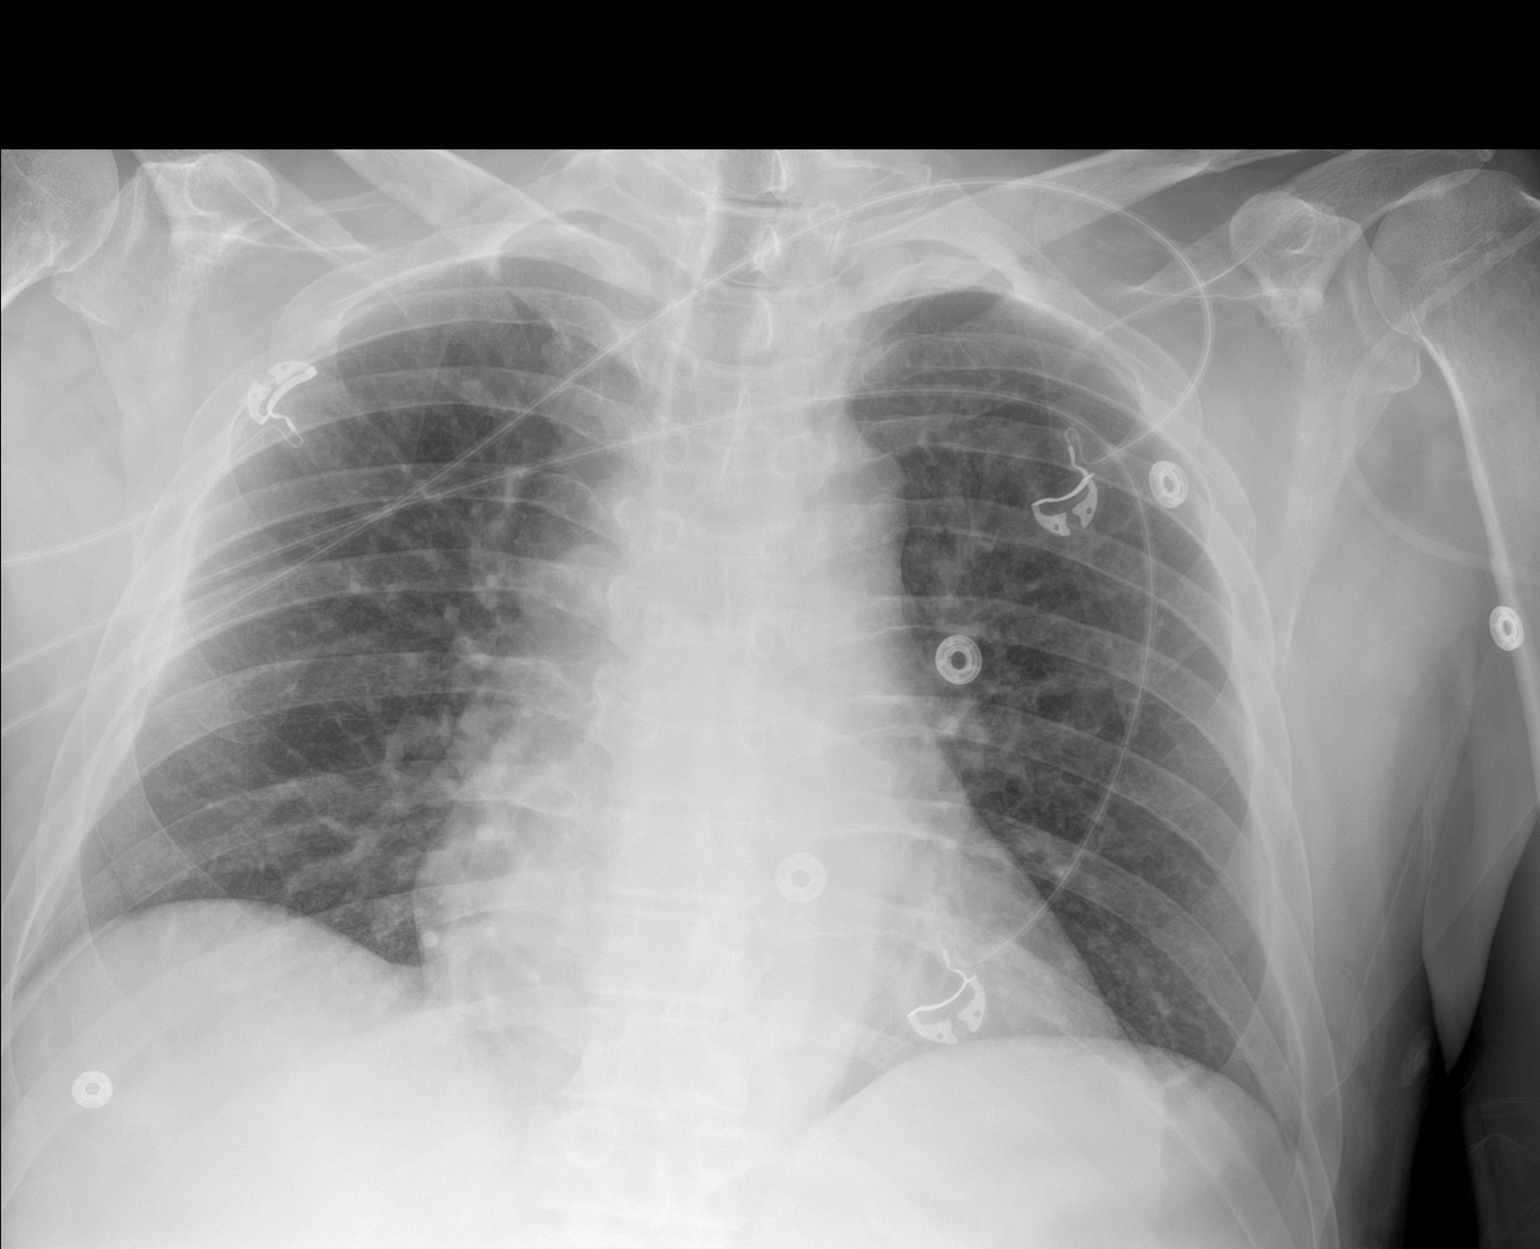

[1 of 1 positions shown; findings below may reference images not displayed]

FINDINGS: Grossly unchanged enlarged cardiac silhouette and mediastinal
contours. Minimal perihilar opacities may represent atelectasis. No
discrete focal airspace opacities. No definite pleural effusion,
though note, the right costophrenic angle is excluded from view. No
pneumothorax. No evidence of edema. No acute osseous abnormalities.
IMPRESSION: Cardiomegaly without superimposed acute cardiopulmonary disease on
this AP portable examination.

## 2023-06-19 ENCOUNTER — Other Ambulatory Visit: Payer: Self-pay | Admitting: Family Medicine

## 2023-06-19 DIAGNOSIS — J449 Chronic obstructive pulmonary disease, unspecified: Secondary | ICD-10-CM

## 2023-06-19 DIAGNOSIS — C61 Malignant neoplasm of prostate: Secondary | ICD-10-CM

## 2023-06-19 DIAGNOSIS — F17200 Nicotine dependence, unspecified, uncomplicated: Secondary | ICD-10-CM

## 2023-06-20 ENCOUNTER — Ambulatory Visit
Admission: RE | Admit: 2023-06-20 | Discharge: 2023-06-20 | Disposition: A | Discharge status: Home / Self Care | Payer: Medicare Other | Source: Ambulatory Visit | Attending: Family Medicine | Admitting: Family Medicine

## 2023-06-20 DIAGNOSIS — C61 Malignant neoplasm of prostate: Secondary | ICD-10-CM

## 2023-06-20 DIAGNOSIS — F17200 Nicotine dependence, unspecified, uncomplicated: Secondary | ICD-10-CM

## 2023-06-20 DIAGNOSIS — J449 Chronic obstructive pulmonary disease, unspecified: Secondary | ICD-10-CM

## 2023-07-09 ENCOUNTER — Encounter: Payer: Self-pay | Admitting: Surgical

## 2023-07-09 ENCOUNTER — Ambulatory Visit: Payer: Medicare Other | Admitting: Surgical

## 2023-07-09 ENCOUNTER — Other Ambulatory Visit (INDEPENDENT_AMBULATORY_CARE_PROVIDER_SITE_OTHER): Payer: Medicare Other

## 2023-07-09 DIAGNOSIS — S59902A Unspecified injury of left elbow, initial encounter: Secondary | ICD-10-CM | POA: Diagnosis not present
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
## 2023-07-10 ENCOUNTER — Other Ambulatory Visit: Payer: Self-pay

## 2023-07-10 ENCOUNTER — Encounter (HOSPITAL_COMMUNITY): Payer: Self-pay | Admitting: Orthopedic Surgery
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
## 2023-07-10 NOTE — Progress Notes (Signed)
SDW CALL  Patient was given pre-op instructions over the phone. The opportunity was given for the patient to ask questions. No further questions asked. Patient verbalized understanding of instructions given.   PCP - Irena Reichmann, DO Cardiologist - Riley Lam, MD  PPM/ICD - denies Device Orders -  Rep Notified -   Chest x-ray -  EKG - 08/22/22 Stress Test - 11/13/22 ECHO - 01/16/22 Cardiac Cath -   Sleep Study - none CPAP -   Fasting Blood Sugar - na Checks Blood Sugar _____ times a day  Blood Thinner Instructions:na Aspirin Instructions:na  ERAS Protcol -clears until 1250 PRE-SURGERY Ensure or G2- no  COVID TEST- no   Anesthesia review: yes- history of CAD, HTN, AAA repair, COPD  Patient denies shortness of breath, fever, cough and chest pain over the phone call    Surgical Instructions    Your procedure is scheduled on July 18  Report to [facility] Main Entrance "A" at 1:20pm A.M., then check in with the Admitting office.  Call this number if you have problems the morning of surgery:  [phone]    Remember:  Do not eat after midnight the night before your surgery  You may drink clear liquids until 1250pm the day of your surgery.   Clear liquids allowed are: Water, Non-Citrus Juices (without pulp), Carbonated Beverages, Clear Tea, Black Coffee ONLY (NO MILK, CREAM OR POWDERED CREAMER of any kind), and Gatorade   Take these medicines the morning of surgery with A SIP OF WATER:  Zetia,Crestor. PRN - Albuterol,Xanax,Flonase  As of today, STOP taking any Aspirin (unless otherwise instructed by your surgeon) Aleve, Naproxen, Ibuprofen, Motrin, Advil, Goody's, BC's, all herbal medications, fish oil, and all vitamins.  [facility] is not responsible for any belongings or valuables. .   Do NOT Smoke (Tobacco/Vaping)  24 hours prior to your procedure  If you use a CPAP at night, you may bring your mask for your overnight stay.   Contacts, glasses,  hearing aids, dentures or partials may not be worn into surgery, please bring cases for these belongings   Patients discharged the day of surgery will not be allowed to drive home, and someone needs to stay with them for 24 hours   Special instructions:    Oral Hygiene is also important to reduce your risk of infection.  Remember - BRUSH YOUR TEETH THE MORNING OF SURGERY WITH YOUR REGULAR TOOTHPASTE   Day of Surgery:  Take a shower the day of or night before with antibacterial soap. Wear Clean/Comfortable clothing the morning of surgery Do not apply any deodorants/lotions.   Do not wear jewelry or makeup Do not wear lotions, powders, perfumes/colognes, or deodorant. Do not shave 48 hours prior to surgery.  Men may shave face and neck. Do not bring valuables to the hospital. Do not wear nail polish, gel polish, artificial nails, or any other type of covering on natural nails (fingers and toes) If you have artificial nails or gel coating that need to be removed by a nail salon, please have this removed prior to surgery. Artificial nails or gel coating may interfere with anesthesia's ability to adequately monitor your vital signs. Remember to brush your teeth WITH YOUR REGULAR TOOTHPASTE.

## 2023-07-10 NOTE — Anesthesia Preprocedure Evaluation (Addendum)
Anesthesia Evaluation  Patient identified by MRN, date of birth, ID band Patient awake    Reviewed: Allergy & Precautions, NPO status , Patient's Chart, lab work & pertinent test results  History of Anesthesia Complications Negative for: history of anesthetic complications  Airway Mallampati: II  TM Distance: >3 FB Neck ROM: Full    Dental  (+) Dental Advisory Given, Missing   Pulmonary COPD,  COPD inhaler, Current SmokerPatient did not abstain from smoking.   Pulmonary exam normal        Cardiovascular hypertension (taken off meds due to dizziness), + CAD and + Peripheral Vascular Disease  Normal cardiovascular exam     Neuro/Psych  PSYCHIATRIC DISORDERS Anxiety Depression     Hearing loss     GI/Hepatic negative GI ROS,,,(+)     substance abuse  alcohol use  Endo/Other   Na 132 K 3.1   Renal/GU negative Renal ROS    Prostate cancer     Musculoskeletal  Gout    Abdominal   Peds  Hematology negative hematology ROS (+)   Anesthesia Other Findings   Reproductive/Obstetrics                             Anesthesia Physical Anesthesia Plan  ASA: 3  Anesthesia Plan: General   Post-op Pain Management: Tylenol PO (pre-op)* and Regional block*   Induction: Intravenous  PONV Risk Score and Plan: 1 and Treatment may vary due to age or medical condition, Ondansetron and Dexamethasone  Airway Management Planned: Oral ETT  Additional Equipment: None  Intra-op Plan:   Post-operative Plan: Extubation in OR  Informed Consent: I have reviewed the patients History and Physical, chart, labs and discussed the procedure including the risks, benefits and alternatives for the proposed anesthesia with the patient or authorized representative who has indicated his/her understanding and acceptance.     Dental advisory given  Plan Discussed with: CRNA and Anesthesiologist  Anesthesia  Plan Comments: (See PAT note )       Anesthesia Quick Evaluation

## 2023-07-11 ENCOUNTER — Ambulatory Visit (HOSPITAL_COMMUNITY)
Admission: RE | Admit: 2023-07-11 | Discharge: 2023-07-11 | Disposition: A | Discharge status: Home / Self Care | Payer: Medicare Other | Attending: Orthopedic Surgery | Admitting: Orthopedic Surgery

## 2023-07-11 ENCOUNTER — Encounter (HOSPITAL_COMMUNITY): Payer: Self-pay | Admitting: Orthopedic Surgery

## 2023-07-11 ENCOUNTER — Other Ambulatory Visit: Payer: Self-pay

## 2023-07-11 ENCOUNTER — Encounter (HOSPITAL_COMMUNITY)
Admission: RE | Disposition: A | Discharge status: Home / Self Care | Payer: Self-pay | Source: Home / Self Care | Attending: Orthopedic Surgery

## 2023-07-11 ENCOUNTER — Ambulatory Visit (HOSPITAL_COMMUNITY): Payer: Medicare Other

## 2023-07-11 ENCOUNTER — Ambulatory Visit (HOSPITAL_BASED_OUTPATIENT_CLINIC_OR_DEPARTMENT_OTHER): Payer: Medicare Other | Admitting: Vascular Surgery

## 2023-07-11 ENCOUNTER — Ambulatory Visit (HOSPITAL_COMMUNITY): Payer: Medicare Other | Admitting: Vascular Surgery

## 2023-07-11 DIAGNOSIS — Z01818 Encounter for other preprocedural examination: Secondary | ICD-10-CM

## 2023-07-11 DIAGNOSIS — E559 Vitamin D deficiency, unspecified: Secondary | ICD-10-CM | POA: Diagnosis not present

## 2023-07-11 DIAGNOSIS — I2510 Atherosclerotic heart disease of native coronary artery without angina pectoris: Secondary | ICD-10-CM | POA: Diagnosis not present

## 2023-07-11 DIAGNOSIS — S52022G Displaced fracture of olecranon process without intraarticular extension of left ulna, subsequent encounter for closed fracture with delayed healing: Secondary | ICD-10-CM

## 2023-07-11 DIAGNOSIS — S52022D Displaced fracture of olecranon process without intraarticular extension of left ulna, subsequent encounter for closed fracture with routine healing: Secondary | ICD-10-CM | POA: Diagnosis not present

## 2023-07-11 DIAGNOSIS — S52022A Displaced fracture of olecranon process without intraarticular extension of left ulna, initial encounter for closed fracture: Secondary | ICD-10-CM | POA: Insufficient documentation

## 2023-07-11 DIAGNOSIS — W19XXXA Unspecified fall, initial encounter: Secondary | ICD-10-CM | POA: Diagnosis not present

## 2023-07-11 DIAGNOSIS — F17210 Nicotine dependence, cigarettes, uncomplicated: Secondary | ICD-10-CM | POA: Diagnosis not present

## 2023-07-11 DIAGNOSIS — I10 Essential (primary) hypertension: Secondary | ICD-10-CM

## 2023-07-11 HISTORY — PX: ORIF ELBOW FRACTURE: SHX5031

## 2023-07-11 LAB — CBC
HCT: 43.3 % (ref 39.0–52.0)
Hemoglobin: 15.1 g/dL (ref 13.0–17.0)
MCH: 34 pg (ref 26.0–34.0)
MCHC: 34.9 g/dL (ref 30.0–36.0)
MCV: 97.5 fL (ref 80.0–100.0)
Platelets: 189 10*3/uL (ref 150–400)
RBC: 4.44 MIL/uL (ref 4.22–5.81)
RDW: 13.1 % (ref 11.5–15.5)
WBC: 7.5 10*3/uL (ref 4.0–10.5)
nRBC: 0 % (ref 0.0–0.2)

## 2023-07-11 LAB — BASIC METABOLIC PANEL
Anion gap: 12 (ref 5–15)
BUN: 6 mg/dL — ABNORMAL LOW (ref 8–23)
CO2: 27 mmol/L (ref 22–32)
Calcium: 8.6 mg/dL — ABNORMAL LOW (ref 8.9–10.3)
Chloride: 93 mmol/L — ABNORMAL LOW (ref 98–111)
Creatinine, Ser: 0.64 mg/dL (ref 0.61–1.24)
GFR, Estimated: >60 mL/min (ref >60)
Glucose, Bld: 89 mg/dL (ref 70–99)
Potassium: 3.1 mmol/L — ABNORMAL LOW (ref 3.5–5.1)
Sodium: 132 mmol/L — ABNORMAL LOW (ref 135–145)

## 2023-07-11 LAB — VITAMIN D 25 HYDROXY (VIT D DEFICIENCY, FRACTURES): Vit D, 25-Hydroxy: 31.78 ng/mL (ref 30–100)

## 2023-07-11 SURGERY — OPEN REDUCTION INTERNAL FIXATION (ORIF) ELBOW/OLECRANON FRACTURE
Anesthesia: General | Site: Elbow | Laterality: Left

## 2023-07-11 MED ORDER — LACTATED RINGERS IV SOLN: INTRAVENOUS | Status: DC

## 2023-07-11 MED ORDER — CHLORHEXIDINE GLUCONATE 0.12 % MT SOLN
15.0000 mL | Freq: Once | OROMUCOSAL | Status: AC
  Administered 2023-07-11: 15 mL via OROMUCOSAL
  Filled 2023-07-11: qty 15

## 2023-07-11 MED ORDER — ESMOLOL HCL 100 MG/10ML IV SOLN
INTRAVENOUS | Status: AC
  Filled 2023-07-11: qty 10

## 2023-07-11 MED ORDER — ORAL CARE MOUTH RINSE: 15.0000 mL | Freq: Once | OROMUCOSAL | Status: AC

## 2023-07-11 MED ORDER — TRANEXAMIC ACID-NACL 1000-0.7 MG/100ML-% IV SOLN
1000.0000 mg | INTRAVENOUS | Status: AC
  Administered 2023-07-11: 1000 mg via INTRAVENOUS
  Filled 2023-07-11: qty 100

## 2023-07-11 MED ORDER — ALBUTEROL SULFATE HFA 108 (90 BASE) MCG/ACT IN AERS
INHALATION_SPRAY | RESPIRATORY_TRACT | Status: AC
  Filled 2023-07-11: qty 6.7

## 2023-07-11 MED ORDER — LIDOCAINE 2% (20 MG/ML) 5 ML SYRINGE
INTRAMUSCULAR | Status: AC
  Filled 2023-07-11: qty 5

## 2023-07-11 MED ORDER — SUGAMMADEX SODIUM 200 MG/2ML IV SOLN
INTRAVENOUS | Status: DC | PRN
  Administered 2023-07-11: 200 mg via INTRAVENOUS

## 2023-07-11 MED ORDER — ONDANSETRON HCL 4 MG/2ML IJ SOLN
INTRAMUSCULAR | Status: AC
  Filled 2023-07-11: qty 2

## 2023-07-11 MED ORDER — PROPOFOL 10 MG/ML IV BOLUS
INTRAVENOUS | Status: AC
  Filled 2023-07-11: qty 20

## 2023-07-11 MED ORDER — LIDOCAINE 2% (20 MG/ML) 5 ML SYRINGE
INTRAMUSCULAR | Status: DC | PRN
  Administered 2023-07-11: 60 mg via INTRAVENOUS

## 2023-07-11 MED ORDER — FENTANYL CITRATE (PF) 100 MCG/2ML IJ SOLN: 25.0000 ug | INTRAMUSCULAR | Status: DC | PRN

## 2023-07-11 MED ORDER — FENTANYL CITRATE (PF) 250 MCG/5ML IJ SOLN
INTRAMUSCULAR | Status: DC | PRN
  Administered 2023-07-11 (×2): 50 ug via INTRAVENOUS
  Administered 2023-07-11: 100 ug via INTRAVENOUS
  Administered 2023-07-11 (×2): 50 ug via INTRAVENOUS

## 2023-07-11 MED ORDER — ONDANSETRON HCL 4 MG/2ML IJ SOLN
INTRAMUSCULAR | Status: DC | PRN
  Administered 2023-07-11: 4 mg via INTRAVENOUS

## 2023-07-11 MED ORDER — BUPIVACAINE-EPINEPHRINE (PF) 0.5% -1:200000 IJ SOLN
INTRAMUSCULAR | Status: DC | PRN
  Administered 2023-07-11: 30 mL via PERINEURAL

## 2023-07-11 MED ORDER — ONDANSETRON HCL 4 MG/2ML IJ SOLN: 4.0000 mg | Freq: Once | INTRAMUSCULAR | Status: DC | PRN

## 2023-07-11 MED ORDER — VANCOMYCIN HCL 1000 MG IV SOLR
INTRAVENOUS | Status: AC
  Filled 2023-07-11: qty 20

## 2023-07-11 MED ORDER — POVIDONE-IODINE 7.5 % EX SOLN: Freq: Once | CUTANEOUS | Status: DC

## 2023-07-11 MED ORDER — ACD FORMULA A 0.73-2.45-2.2 GM/100ML VI SOLN
Status: AC | PRN
  Administered 2023-07-11: 30 mL

## 2023-07-11 MED ORDER — 0.9 % SODIUM CHLORIDE (POUR BTL) OPTIME
TOPICAL | Status: DC | PRN
  Administered 2023-07-11 (×3): 1000 mL
  Administered 2023-07-11: 2000 mL

## 2023-07-11 MED ORDER — DEXAMETHASONE SODIUM PHOSPHATE 10 MG/ML IJ SOLN
INTRAMUSCULAR | Status: AC
  Filled 2023-07-11: qty 1

## 2023-07-11 MED ORDER — METHOCARBAMOL 500 MG PO TABS: 500.0000 mg | ORAL_TABLET | Freq: Three times a day (TID) | ORAL | 1 refills | Status: DC | PRN

## 2023-07-11 MED ORDER — OXYCODONE HCL 5 MG PO TABS: 5.0000 mg | ORAL_TABLET | Freq: Once | ORAL | Status: DC | PRN

## 2023-07-11 MED ORDER — FENTANYL CITRATE (PF) 100 MCG/2ML IJ SOLN
INTRAMUSCULAR | Status: AC
  Administered 2023-07-11: 50 ug
  Filled 2023-07-11: qty 2

## 2023-07-11 MED ORDER — PHENYLEPHRINE HCL-NACL 20-0.9 MG/250ML-% IV SOLN
INTRAVENOUS | Status: DC | PRN
  Administered 2023-07-11: 30 ug/min via INTRAVENOUS

## 2023-07-11 MED ORDER — DEXAMETHASONE SODIUM PHOSPHATE 10 MG/ML IJ SOLN
INTRAMUSCULAR | Status: DC | PRN
  Administered 2023-07-11: 10 mg via INTRAVENOUS

## 2023-07-11 MED ORDER — ROCURONIUM BROMIDE 10 MG/ML (PF) SYRINGE
PREFILLED_SYRINGE | INTRAVENOUS | Status: DC | PRN
  Administered 2023-07-11: 60 mg via INTRAVENOUS

## 2023-07-11 MED ORDER — FENTANYL CITRATE (PF) 250 MCG/5ML IJ SOLN
INTRAMUSCULAR | Status: AC
  Filled 2023-07-11: qty 5

## 2023-07-11 MED ORDER — CEFAZOLIN SODIUM-DEXTROSE 2-4 GM/100ML-% IV SOLN
2.0000 g | INTRAVENOUS | Status: AC
  Administered 2023-07-11: 2 g via INTRAVENOUS
  Filled 2023-07-11: qty 100

## 2023-07-11 MED ORDER — OXYCODONE HCL 5 MG/5ML PO SOLN: 5.0000 mg | Freq: Once | ORAL | Status: DC | PRN

## 2023-07-11 MED ORDER — ROCURONIUM BROMIDE 10 MG/ML (PF) SYRINGE
PREFILLED_SYRINGE | INTRAVENOUS | Status: AC
  Filled 2023-07-11: qty 20

## 2023-07-11 MED ORDER — CEPHALEXIN 500 MG PO CAPS: ORAL_CAPSULE | ORAL | 0 refills | Status: DC

## 2023-07-11 MED ORDER — HEPARIN SODIUM (PORCINE) 1000 UNIT/ML IJ SOLN
INTRAMUSCULAR | Status: AC
  Filled 2023-07-11: qty 1

## 2023-07-11 MED ORDER — ESMOLOL HCL 100 MG/10ML IV SOLN
INTRAVENOUS | Status: DC | PRN
  Administered 2023-07-11: 30 mg via INTRAVENOUS

## 2023-07-11 MED ORDER — BUPIVACAINE HCL 0.25 % IJ SOLN
INTRAMUSCULAR | Status: DC | PRN
  Administered 2023-07-11: 4 mL

## 2023-07-11 MED ORDER — OXYCODONE HCL 5 MG PO TABS: 5.0000 mg | ORAL_TABLET | ORAL | 0 refills | Status: DC | PRN

## 2023-07-11 MED ORDER — PROPOFOL 10 MG/ML IV BOLUS
INTRAVENOUS | Status: DC | PRN
Start: 2023-07-11 — End: 2023-07-11
  Administered 2023-07-11: 150 mg via INTRAVENOUS
  Administered 2023-07-11: 30 mg via INTRAVENOUS

## 2023-07-11 MED ORDER — BUPIVACAINE-EPINEPHRINE (PF) 0.5% -1:200000 IJ SOLN
INTRAMUSCULAR | Status: AC
  Filled 2023-07-11: qty 30

## 2023-07-11 MED ORDER — MIDAZOLAM HCL 2 MG/2ML IJ SOLN
INTRAMUSCULAR | Status: AC
  Administered 2023-07-11: 1 mg
  Filled 2023-07-11: qty 2

## 2023-07-11 MED ORDER — ACETAMINOPHEN 500 MG PO TABS
1000.0000 mg | ORAL_TABLET | Freq: Once | ORAL | Status: AC
  Administered 2023-07-11: 1000 mg via ORAL
  Filled 2023-07-11: qty 2

## 2023-07-11 MED ORDER — HEPARIN SODIUM (PORCINE) 1000 UNIT/ML IJ SOLN
INTRAMUSCULAR | Status: DC | PRN
  Administered 2023-07-11: 5000 [IU] via INTRAVENOUS

## 2023-07-11 MED ORDER — POVIDONE-IODINE 10 % EX SWAB
2.0000 | Freq: Once | CUTANEOUS | Status: AC
  Administered 2023-07-11: 2 via TOPICAL

## 2023-07-11 MED ORDER — BUPIVACAINE HCL (PF) 0.25 % IJ SOLN
INTRAMUSCULAR | Status: AC
  Filled 2023-07-11: qty 30

## 2023-07-11 SURGICAL SUPPLY — 85 items
BAG COUNTER SPONGE SURGICOUNT (BAG) ×2 IMPLANT
BAG SPNG CNTER NS LX DISP (BAG) ×1
BANDAGE ESMARK 6X9 LF (GAUZE/BANDAGES/DRESSINGS) IMPLANT
BIT DRILL 2.5X2.75 QC CALB (BIT) IMPLANT
BIT DRILL CALIBRATED 2.7 (BIT) IMPLANT
BLADE SURG 15 STRL LF DISP TIS (BLADE) IMPLANT
BLADE SURG 15 STRL SS (BLADE) ×2
BNDG CMPR 5X4 KNIT ELC UNQ LF (GAUZE/BANDAGES/DRESSINGS) ×1
BNDG CMPR 6 X 5 YARDS HK CLSR (GAUZE/BANDAGES/DRESSINGS) ×1
BNDG CMPR 6"X 5 YARDS HK CLSR (GAUZE/BANDAGES/DRESSINGS) ×1
BNDG CMPR 9X4 STRL LF SNTH (GAUZE/BANDAGES/DRESSINGS) ×1
BNDG CMPR 9X6 STRL LF SNTH (GAUZE/BANDAGES/DRESSINGS) ×1
BNDG ELASTIC 4INX 5YD STR LF (GAUZE/BANDAGES/DRESSINGS) IMPLANT
BNDG ELASTIC 4X5.8 VLCR STR LF (GAUZE/BANDAGES/DRESSINGS) ×2 IMPLANT
BNDG ELASTIC 6INX 5YD STR LF (GAUZE/BANDAGES/DRESSINGS) IMPLANT
BNDG ESMARK 4X9 LF (GAUZE/BANDAGES/DRESSINGS) IMPLANT
BNDG ESMARK 6X9 LF (GAUZE/BANDAGES/DRESSINGS) ×1
COVER MAYO STAND STRL (DRAPES) IMPLANT
COVER SURGICAL LIGHT HANDLE (MISCELLANEOUS) ×2 IMPLANT
CUFF TOURN SGL QUICK 18X4 (TOURNIQUET CUFF) ×2 IMPLANT
DRAPE C-ARM 42X72 X-RAY (DRAPES) IMPLANT
DRAPE INCISE IOBAN 66X45 STRL (DRAPES) IMPLANT
DRAPE ORTHO SPLIT 77X108 STRL (DRAPES) ×2
DRAPE SURG 17X23 STRL (DRAPES) IMPLANT
DRAPE SURG ORHT 6 SPLT 77X108 (DRAPES) IMPLANT
DRAPE U-SHAPE 47X51 STRL (DRAPES) ×2 IMPLANT
DRSG ADAPTIC 3X8 NADH LF (GAUZE/BANDAGES/DRESSINGS) IMPLANT
DRSG AQUACEL AG ADV 3.5X10 (GAUZE/BANDAGES/DRESSINGS) IMPLANT
DRSG TEGADERM 2-3/8X2-3/4 SM (GAUZE/BANDAGES/DRESSINGS) IMPLANT
DURAPREP 26ML APPLICATOR (WOUND CARE) ×2 IMPLANT
ELECT REM PT RETURN 9FT ADLT (ELECTROSURGICAL) ×1
ELECTRODE REM PT RTRN 9FT ADLT (ELECTROSURGICAL) ×2 IMPLANT
GAUZE PAD ABD 8X10 STRL (GAUZE/BANDAGES/DRESSINGS) IMPLANT
GAUZE SPONGE 4X4 12PLY STRL (GAUZE/BANDAGES/DRESSINGS) ×2 IMPLANT
GAUZE XEROFORM 1X8 LF (GAUZE/BANDAGES/DRESSINGS) ×2 IMPLANT
GLOVE BIOGEL PI IND STRL 8 (GLOVE) ×2 IMPLANT
GLOVE ECLIPSE 8.0 STRL XLNG CF (GLOVE) ×2 IMPLANT
GOWN STRL REUS W/ TWL LRG LVL3 (GOWN DISPOSABLE) ×6 IMPLANT
GOWN STRL REUS W/TWL LRG LVL3 (GOWN DISPOSABLE) ×3
K-WIRE FIXATION 2.0X6 (WIRE) ×2
KIT BASIN OR (CUSTOM PROCEDURE TRAY) ×2 IMPLANT
KIT BONE MRW ASP ANGEL CPRP (KITS) IMPLANT
KIT TURNOVER KIT B (KITS) ×2 IMPLANT
KWIRE FIXATION 2.0X6 (WIRE) IMPLANT
MANIFOLD NEPTUNE II (INSTRUMENTS) ×2 IMPLANT
NDL 25GX 5/8IN NON SAFETY (NEEDLE) IMPLANT
NDL HYPO 22X1.5 SAFETY MO (MISCELLANEOUS) IMPLANT
NEEDLE 25GX 5/8IN NON SAFETY (NEEDLE) ×1 IMPLANT
NEEDLE HYPO 22X1.5 SAFETY MO (MISCELLANEOUS) ×1 IMPLANT
NS IRRIG 1000ML POUR BTL (IV SOLUTION) ×2 IMPLANT
PACK ORTHO EXTREMITY (CUSTOM PROCEDURE TRAY) ×2 IMPLANT
PAD ARMBOARD 7.5X6 YLW CONV (MISCELLANEOUS) ×4 IMPLANT
PAD CAST 4YDX4 CTTN HI CHSV (CAST SUPPLIES) ×2 IMPLANT
PADDING CAST COTTON 4X4 STRL (CAST SUPPLIES) ×1
PADDING CAST SYNTHETIC 3X4 NS (CAST SUPPLIES) IMPLANT
PLATE OLECRANON LRG (Plate) IMPLANT
PUTTY DBM ALLOSYNC PURE 5CC (Putty) IMPLANT
SCREW CORTICAL LOW PROF 3.5X32 (Screw) IMPLANT
SCREW LOCK CORT STAR 3.5X12 (Screw) IMPLANT
SCREW LOCK CORT STAR 3.5X16 (Screw) IMPLANT
SCREW LOCK CORT STAR 3.5X20 (Screw) IMPLANT
SCREW LOCK CORT STAR 3.5X52 (Screw) IMPLANT
SCREW LOW PROFILE 3.5X30MM TIS (Screw) IMPLANT
SCREW LP NL T15 3.5X24 (Screw) IMPLANT
SLING ARM IMMOBILIZER XL (CAST SUPPLIES) IMPLANT
SPLINT PLASTER CAST XFAST 5X30 (CAST SUPPLIES) IMPLANT
SPONGE T-LAP 4X18 ~~LOC~~+RFID (SPONGE) ×4 IMPLANT
STAPLER VISISTAT 35W (STAPLE) ×2 IMPLANT
STOCKINETTE IMPERVIOUS 9X36 MD (GAUZE/BANDAGES/DRESSINGS) IMPLANT
SUCTION TUBE FRAZIER 10FR DISP (SUCTIONS) ×2 IMPLANT
SUT ETHILON 3 0 PS 1 (SUTURE) IMPLANT
SUT VIC AB 0 CT1 27 (SUTURE) ×3
SUT VIC AB 0 CT1 27XBRD ANBCTR (SUTURE) ×4 IMPLANT
SUT VIC AB 1 CT1 36 (SUTURE) IMPLANT
SUT VIC AB 2-0 CT1 27 (SUTURE) ×2
SUT VIC AB 2-0 CT1 TAPERPNT 27 (SUTURE) ×4 IMPLANT
SUT VIC AB 2-0 CT2 27 (SUTURE) IMPLANT
SYR 3ML LL SCALE MARK (SYRINGE) IMPLANT
SYR CONTROL 10ML LL (SYRINGE) IMPLANT
TOWEL GREEN STERILE (TOWEL DISPOSABLE) ×2 IMPLANT
TOWEL GREEN STERILE FF (TOWEL DISPOSABLE) ×2 IMPLANT
TUBE CONNECTING 12X1/4 (SUCTIONS) ×2 IMPLANT
WASHER 3.5MM (Orthopedic Implant) IMPLANT
WATER STERILE IRR 1000ML POUR (IV SOLUTION) ×2 IMPLANT
YANKAUER SUCT BULB TIP NO VENT (SUCTIONS) IMPLANT

## 2023-07-11 NOTE — Anesthesia Procedure Notes (Signed)
Anesthesia Regional Block: Supraclavicular block   Pre-Anesthetic Checklist: , timeout performed,  Correct Patient, Correct Site, Correct Laterality,  Correct Procedure, Correct Position, site marked,  Risks and benefits discussed,  Surgical consent,  Pre-op evaluation,  At surgeon's request and post-op pain management  Laterality: Left  Prep: chloraprep       Needles:  Injection technique: Single-shot  Needle Type: Echogenic Needle     Needle Length: 5cm  Needle Gauge: 21     Additional Needles:   Narrative:  Start time: 07/11/2023 5:06 PM End time: 07/11/2023 5:09 PM Injection made incrementally with aspirations every 5 mL.  Performed by: Personally  Anesthesiologist: Beryle Lathe, MD  Additional Notes: No pain on injection. No increased resistance to injection. Injection made in 5cc increments. Good needle visualization. Patient tolerated the procedure well.

## 2023-07-11 NOTE — Anesthesia Procedure Notes (Signed)
Procedure Name: Intubation Date/Time: 07/11/2023 6:15 PM  Performed by: Darryl Nestle, CRNAPre-anesthesia Checklist: Patient identified, Emergency Drugs available, Suction available and Patient being monitored Patient Re-evaluated:Patient Re-evaluated prior to induction Oxygen Delivery Method: Circle system utilized Preoxygenation: Pre-oxygenation with 100% oxygen Induction Type: IV induction Ventilation: Mask ventilation without difficulty Laryngoscope Size: Mac and 4 Grade View: Grade I Tube type: Oral Tube size: 7.5 mm Number of attempts: 1 Airway Equipment and Method: Stylet and Oral airway Placement Confirmation: ETT inserted through vocal cords under direct vision, positive ETCO2 and breath sounds checked- equal and bilateral Secured at: 23 cm Tube secured with: Tape Dental Injury: Teeth and Oropharynx as per pre-operative assessment

## 2023-07-11 NOTE — Transfer of Care (Signed)
Immediate Anesthesia Transfer of Care Note  Patient: Anthony Summers  Procedure(s) Performed: OPEN REDUCTION INTERNAL FIXATION (ORIF) ELBOW/OLECRANON FRACTURE WITH BONE GRAFT AT FRACTURE SITE WITH BONE MARROW ASPIRATE FROM ILIAC CREST (Left: Elbow)  Patient Location: PACU  Anesthesia Type:General and GA combined with regional for post-op pain  Level of Consciousness: drowsy  Airway & Oxygen Therapy: Patient Spontanous Breathing  Post-op Assessment: Report given to RN and Post -op Vital signs reviewed and stable  Post vital signs: Reviewed and stable  Last Vitals:  Vitals Value Taken Time  BP 154/82 07/11/23 2111  Temp    Pulse 88 07/11/23 2113  Resp 15 07/11/23 2113  SpO2 100 % 07/11/23 2113  Vitals shown include unfiled device data.  Last Pain:  Vitals:   07/11/23 1449  TempSrc: Oral  PainSc: 0-No pain         Complications: No notable events documented.

## 2023-07-11 NOTE — Brief Op Note (Signed)
   07/11/2023  9:04 PM  PATIENT:  Anthony Summers  67 y.o. male  PRE-OPERATIVE DIAGNOSIS:  left olecranon fracture  POST-OPERATIVE DIAGNOSIS:  left olecranon fracture  PROCEDURE:  Procedure(s): OPEN REDUCTION INTERNAL FIXATION (ORIF) ELBOW/OLECRANON FRACTURE WITH BONE GRAFT AT FRACTURE SITE WITH BONE MARROW ASPIRATE FROM ILIAC CREST  SURGEON:  Surgeon(s): Cammy Copa, MD  ASSISTANT: magnant pa  ANESTHESIA:   general  EBL: 10 ml    Total I/O In: 600 [I.V.:600] Out: 50 [Blood:50]  BLOOD ADMINISTERED: none  DRAINS: none   LOCAL MEDICATIONS USED:  vanco marcaine hip  SPECIMEN:  No Specimen  COUNTS:  YES  TOURNIQUET:   Total Tourniquet Time Documented: Upper Arm (Left) - 83 minutes Total: Upper Arm (Left) - 83 minutes   DICTATION: .Other Dictation: Dictation Number [id number]  PLAN OF CARE: Discharge to home after PACU  PATIENT DISPOSITION:  PACU - hemodynamically stable

## 2023-07-11 NOTE — Anesthesia Postprocedure Evaluation (Signed)
Anesthesia Post Note  Patient: JAHDEN SCHARA  Procedure(s) Performed: OPEN REDUCTION INTERNAL FIXATION (ORIF) ELBOW/OLECRANON FRACTURE WITH BONE GRAFT AT FRACTURE SITE WITH BONE MARROW ASPIRATE FROM ILIAC CREST (Left: Elbow)     Patient location during evaluation: PACU Anesthesia Type: General and Regional Level of consciousness: awake and alert Pain management: pain level controlled Vital Signs Assessment: post-procedure vital signs reviewed and stable Respiratory status: spontaneous breathing, nonlabored ventilation and respiratory function stable Cardiovascular status: blood pressure returned to baseline and stable Postop Assessment: no apparent nausea or vomiting Anesthetic complications: no  No notable events documented.  Last Vitals:  Vitals:   07/11/23 2113 07/11/23 2115  BP: (!) 154/82 (!) 148/88  Pulse: 87 92  Resp: 13 16  Temp: 36.7 C   SpO2: 100% 95%    Last Pain:  Vitals:   07/11/23 2113  TempSrc:   PainSc: 0-No pain                 Chyan Carnero,W. EDMOND
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
## 2023-07-12 ENCOUNTER — Encounter (HOSPITAL_COMMUNITY): Payer: Self-pay | Admitting: Orthopedic Surgery

## 2023-07-12 ENCOUNTER — Telehealth: Payer: Self-pay | Admitting: Orthopedic Surgery

## 2023-07-12 NOTE — Telephone Encounter (Signed)
Patient called. He needs a PO FU next week. Would like to be worked in. Cb [phone]

## 2023-07-12 NOTE — Telephone Encounter (Signed)
Pt is s/p ORIF elbow fx sw Luke and called pt. made an appt for Dr. August Saucer 07/19/2023 at 9:30

## 2023-07-14 ENCOUNTER — Encounter: Payer: Self-pay | Admitting: Physician Assistant

## 2023-07-14 NOTE — Progress Notes (Unsigned)
Cardiology Office Note    Date:  07/15/2023  ID:  Anthony Summers, DOB October 20, 1956, MRN [id number] PCP:  Anthony Reichmann, DO  Cardiologist:  Anthony Constant, MD  Electrophysiologist:  None   Chief Complaint: f/u cor calcification  History of Present Illness: .    Anthony Summers is a 67 y.o. male with visit-pertinent history of HTN, HLD, COPD, tobacco use, prostate CA, AAA s/p repair 01/2022 along with LE PAD by duplex and mild 1-39% bilateral carotid artery disease by duplex 12/2021 followed by [facility]VS, coronary calcification, hepatic steatosis by CT seen for follow-up. Cardiac PET 10/2022 for coronary calcification showed: "LV perfusion is normal. There is no evidence of ischemia. There is no evidence of infarction. No TID.  There is global decrease in MBFR 1.59 with high resting flows.   Rest left ventricular function is normal. Rest EF: 79 %. Stress left ventricular function is normal. Stress EF: 67 %. End diastolic cavity size is normal.   Myocardial blood flow was computed to be 1.65ml/g/min at rest and 3.84ml/g/min at stress. Global myocardial blood flow reserve was 1.61 and was abnormal.   Coronary calcium was present on the attenuation correction CT images. Coronary calcifications were present in the left anterior descending artery, left circumflex artery and right coronary artery distribution(s).   The study is normal. The study is intermediate risk."  Dr. Izora Ribas reported at last OV 10/2022 "normal stress flows" and medical therapy was recommended. Occasional PVCs were also seen on prior EKG. Echo 12/2021 showed EF 69%, G1DD, borderline dilation of ascending aorta.  He is seen for follow-up today doing well.  His wife accompanies him on speaker phone. He states he does not know why he is here. He denies any CP, SOB, palpitations, syncope or any other acute concerns. His blood pressure has been episodically elevated at home - brings in a log with periodic readings back to  December. Other times it's completely normal, I.e. 118-120 systolic. He reports that losartan was previously stopped due to some shakiness on standing that improved after stopping this medicine.BP was 126/70, recheck 142/82 by me. He recently broke his arm and had surgery that was uneventful. He stopped his ASA a while back but denies having any issues while on this.  Labwork independently reviewed: 06/2023 K 3.1, Na 132, Cr 0.64, CBC wnl 10/2022 LDL 46, trig 90, ALT wnl  4098 TSH wnl, PCP following  ROS: .    Please see the history of present illness. All other systems are reviewed and otherwise negative.   Studies Reviewed: Marland Kitchen    EKG:  EKG is ordered today, personally reviewed, demonstrating NSR 98bpm, nonspecific STTW changes similar to prior except no ectopy this time  CV Studies: Cardiac studies reviewed are outlined and summarized above. Otherwise please see EMR for full report.  Physical Exam:    VS:  BP 126/70   Pulse 98   Ht 6\' 1"  (1.854 m)   Wt 190 lb (86.2 kg)   SpO2 93%   BMI 25.07 kg/m    Wt Readings from Last 3 Encounters:  07/15/23 190 lb (86.2 kg)  07/11/23 184 lb (83.5 kg)  01/17/23 190 lb 12.8 oz (86.5 kg)    GEN: Well nourished, well developed in no acute distress NECK: No JVD; No carotid bruits CARDIAC: RRR, no murmurs, rubs, gallops RESPIRATORY: Scattered wheeze/rhonchi. Pt reports h/o COPD. No rales.  ABDOMEN: Soft, non-tender, non-distended EXTREMITIES:  No edema; No acute deformity   Asessement and Plan:.  1. Coronary artery calcifications by CT, HLD - based on PET scan results, Dr. Izora Ribas felt he had normal stress flows and recommended continued medical therapy. I have suggested he touch base with ortho and inquire if they are OK if he restarts ASA 81mg  daily since he is now post-operative from arm surgery. Continue rosuvastatin, ezetimibe. He thinks he had issues with higher doses of rosuvastatin prompting ezetimibe. He reports having  cholesterol check planned tomorrow with PCP. Recommend goal LDL <70. I told him to let us know if they aren't checking this tomorrow in which case I'd suggest setting this up for 10/2023. He is not presently interested in quitting smoking.  2. Aortic atherosclerosis, borderline dilation of ascending aorta - continue lipid management as above. Repeat echocardiogram to trend ascending aortic dilation given h/o AAA repair followed by vascular surgery. He also has PAD and carotid disease which is otherwise followed by [facility].  3. HTN - BP normal by CMA check, recheck by me 142/82. We discussed potentially adding back losartan. He would like to hold off at this time as he had had what sounds like some orthostasis while he was on it. Low dose amlodipine could be considered if needed in the future. He is also maintained on Lasix by PCP, reports otherwise doing well on this. His recent surgical labs showed K of 3.1. We discussed rechecking today. He reports having full labs planned with PCP tomorrow and would like to keep those instead. The patient was instructed to monitor their blood pressure at home and to call if tending to run higher than 130 systolic.  4. PVCs - quiescent by exam and symptoms. He is noted to have ULN basal HR in the 90s which appears chronic intermittently going back several years. He reports PCP follow-up for labs tomorrow and declined recheck lytes today. TSH 2020 was normal and has been followed by PCP. I asked him to have his PCP send copy of labs when they result. While beta blocker could be considered in the treatment of his HTN/PVCs if they become a progressive issue, he also has COPD evident on examination therefore would hold off to avoid worsening bronchospasm unless becomes symptomatic or develops LV dysfunction/abnormalities on echocardiogam.    Disposition: F/u with Dr. Izora Ribas in 6 months (interval previously recommended by MD)  Signed, Laurann Montana, PA-C

## 2023-07-15 ENCOUNTER — Encounter: Payer: Self-pay | Admitting: Physician Assistant

## 2023-07-15 ENCOUNTER — Ambulatory Visit: Payer: Medicare Other | Attending: Physician Assistant | Admitting: Physician Assistant

## 2023-07-15 VITALS — BP 126/70 | HR 98 | Ht 73.0 in | Wt 190.0 lb

## 2023-07-15 DIAGNOSIS — I2510 Atherosclerotic heart disease of native coronary artery without angina pectoris: Secondary | ICD-10-CM

## 2023-07-15 DIAGNOSIS — I2584 Coronary atherosclerosis due to calcified coronary lesion: Secondary | ICD-10-CM | POA: Diagnosis present

## 2023-07-15 DIAGNOSIS — I493 Ventricular premature depolarization: Secondary | ICD-10-CM | POA: Diagnosis present

## 2023-07-15 DIAGNOSIS — E785 Hyperlipidemia, unspecified: Secondary | ICD-10-CM | POA: Diagnosis present

## 2023-07-15 DIAGNOSIS — I10 Essential (primary) hypertension: Secondary | ICD-10-CM

## 2023-07-15 DIAGNOSIS — I77810 Thoracic aortic ectasia: Secondary | ICD-10-CM

## 2023-07-15 DIAGNOSIS — I700 Atherosclerosis of aorta: Secondary | ICD-10-CM | POA: Diagnosis present

## 2023-07-15 NOTE — Patient Instructions (Signed)
Medication Instructions:  Your physician recommends that you continue on your current medications as directed. Please refer to the Current Medication list given to you today.  *If you need a refill on your cardiac medications before your next appointment, please call your pharmacy*   Lab Work: None  If you have labs (blood work) drawn today and your tests are completely normal, you will receive your results only by: MyChart Message (if you have MyChart) OR A paper copy in the mail If you have any lab test that is abnormal or we need to change your treatment, we will call you to review the results.   Testing/Procedures: Your physician has requested that you have an echocardiogram. Echocardiography is a painless test that uses sound waves to create images of your heart. It provides your doctor with information about the size and shape of your heart and how well your heart's chambers and valves are working. This procedure takes approximately one hour. There are no restrictions for this procedure. Please do NOT wear cologne, aftershave, or lotions (deodorant is allowed). Please arrive 15 minutes prior to your appointment time.    Follow-Up: At [facility], you and your health needs are our priority.  As part of our continuing mission to provide you with exceptional heart care, we have created designated Provider Care Teams.  These Care Teams include your primary Cardiologist (physician) and Advanced Practice Providers (APPs -  Physician Assistants and Nurse Practitioners) who all work together to provide you with the care you need, when you need it.  We recommend signing up for the patient portal called "MyChart".  Sign up information is provided on this After Visit Summary.  MyChart is used to connect with patients for Virtual Visits (Telemedicine).  Patients are able to view lab/test results, encounter notes, upcoming appointments, etc.  Non-urgent messages can be sent to your  provider as well.   To learn more about what you can do with MyChart, go to [email or web address].    Your next appointment:   6 month(s)  Provider:   Christell Constant, MD     Other Instructions Please ask Orthopedic provider if it's ok to restart Aspirin 81mg  daily.

## 2023-07-16 ENCOUNTER — Encounter (HOSPITAL_COMMUNITY): Payer: Self-pay | Admitting: Orthopedic Surgery

## 2023-07-18 ENCOUNTER — Encounter: Payer: Self-pay | Admitting: Physician Assistant

## 2023-07-18 ENCOUNTER — Ambulatory Visit (INDEPENDENT_AMBULATORY_CARE_PROVIDER_SITE_OTHER): Payer: Medicare Other | Admitting: Physician Assistant

## 2023-07-18 ENCOUNTER — Ambulatory Visit (HOSPITAL_COMMUNITY)
Admission: RE | Admit: 2023-07-18 | Discharge: 2023-07-18 | Disposition: A | Discharge status: Home / Self Care | Payer: Medicare Other | Source: Ambulatory Visit | Attending: Vascular Surgery | Admitting: Vascular Surgery

## 2023-07-18 VITALS — BP 147/78 | HR 81 | Temp 98.0°F | Resp 18 | Ht 73.0 in | Wt 191.8 lb

## 2023-07-18 DIAGNOSIS — Z9889 Other specified postprocedural states: Secondary | ICD-10-CM

## 2023-07-18 DIAGNOSIS — I6523 Occlusion and stenosis of bilateral carotid arteries: Secondary | ICD-10-CM | POA: Diagnosis not present

## 2023-07-18 DIAGNOSIS — I714 Abdominal aortic aneurysm, without rupture, unspecified: Secondary | ICD-10-CM | POA: Diagnosis not present
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
## 2023-07-19 ENCOUNTER — Other Ambulatory Visit (INDEPENDENT_AMBULATORY_CARE_PROVIDER_SITE_OTHER): Payer: Medicare Other

## 2023-07-19 ENCOUNTER — Ambulatory Visit (INDEPENDENT_AMBULATORY_CARE_PROVIDER_SITE_OTHER): Payer: Medicare Other | Admitting: Orthopedic Surgery

## 2023-07-19 ENCOUNTER — Encounter: Payer: Self-pay | Admitting: Orthopedic Surgery

## 2023-07-19 ENCOUNTER — Telehealth: Payer: Self-pay

## 2023-07-19 DIAGNOSIS — M25522 Pain in left elbow: Secondary | ICD-10-CM | POA: Diagnosis not present

## 2023-07-19 DIAGNOSIS — M5416 Radiculopathy, lumbar region: Secondary | ICD-10-CM
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
## 2023-07-19 NOTE — Addendum Note (Signed)
Addended by: Ashok Norris on: 07/19/2023 11:39 AM   Modules accepted: Orders

## 2023-07-19 NOTE — Telephone Encounter (Signed)
Patient stating that he has gotten good relief from injection he had a few months ago. Asking for repeat injection if possible. No injury but did have recurrent symptoms after doing a lot of walking this week while his mom was in the hospital. Please advise.

## 2023-07-20 DIAGNOSIS — S52022G Displaced fracture of olecranon process without intraarticular extension of left ulna, subsequent encounter for closed fracture with delayed healing: Secondary | ICD-10-CM

## 2023-07-22 ENCOUNTER — Telehealth: Payer: Self-pay

## 2023-07-22 NOTE — Telephone Encounter (Signed)
scheduled

## 2023-07-22 NOTE — Telephone Encounter (Signed)
-----   Message from Burnard Bunting sent at 07/19/2023  6:47 PM EDT ----- Please follow-up with Anthony Summers next week for suture removal thanks

## 2023-07-23 NOTE — Op Note (Unsigned)
NAME: Anthony Summers, Anthony Summers MEDICAL RECORD NO: [id number] ACCOUNT NO: [id number] DATE OF BIRTH: 1956-09-21 FACILITY: [facility] LOCATION: [facility] PHYSICIAN: Graylin Shiver. August Saucer, MD  Operative Report   PREOPERATIVE DIAGNOSIS:  Left displaced elbow olecranon fracture.  POSTOPERATIVE DIAGNOSIS:  Left displaced elbow olecranon fracture.  PROCEDURE: Open reduction and internal fixation of displaced left elbow olecranon fracture.  SURGEON:  Cammy Copa, MD  ASSISTANT:  Karenann Cai, PA.  INDICATIONS:  The patient is a 67 year old patient with left elbow olecranon fracture, presents for operative management after explanation of risks and benefits.  DESCRIPTION OF PROCEDURE:  The patient was brought to the operating room where general endotracheal anesthesia was induced.  Preoperative antibiotics administered.  Timeout was called.  Left iliac crest and left arm was prescrubbed with alcohol and  Betadine, allowed to air dry, prepped with DuraPrep solution and draped in sterile manner.  Ioban used to cover the operative field.  After calling a timeout, small incision was made about 3 cm proximal to anterior-superior iliac crest on the left. An  iliac crest harvesting needle was placed between the tables and about 40 mL of bone marrow aspirate was obtained.  This was spun down using Arthrex Centrifuge and we obtained about 3 mL of BMAC.  This was added to the bone grafting later in the case.   Concurrently, the left arm was elevated and exsanguinated with Esmarch wrap.  Tourniquet was inflated.  Incision made over the olecranon fracture extending distally.  Small area of granulation tissue was elliptically excised.  There was not a sinus tract  present.  Underlying soft tissue was sharply debrided from this region.  Fracture was visualized.  Soft tissue elevated off the ulna itself.  The fracture was irrigated with about 3 liters of irrigating solution.  Fracture edges were visualized.  At  this time, after  irrigating and cleaning out the fracture site, the bone graft was placed in the actual fracture site itself.  We then obtained very good compression and reduction of the fracture using forceps.  Plate was applied and correct placement of  the plate was confirmed under fluoroscopy.  Two K-wires were placed in the proximal and distal aspect of the fracture.  Once good fracture reduction was confirmed, we placed 2 locking screws in the proximal small olecranon portion and then used 2  sequential compression screws in the oblong holes in the shaft in order to achieve more compression.  Fluoroscopic imaging demonstrated good compression.  We were required to bend the plates some in order to get the home run screws in good position.   Those 2 screws were then placed in good position and alignment.  Good purchase obtained.  We then placed 2 more screws in the shaft for optimal fixation.  Fluoroscopy demonstrated good placement of the plate in the AP and lateral planes.  Next, the  tourniquet was released.  Bleeding points encountered controlled using electrocautery.  Thorough irrigation was again performed and bone graft was then applied and an extra bone graft was then applied around the fracture.  Soft tissue coverage over the  plate was achieved using 0 Vicryl suture followed by interrupted inverted 2-0 Vicryl suture and 3-0 nylon. We did place vancomycin powder on the plate prior to closure.  Aquacel dressing placed.  Regarding the hip, we anesthetized it with Marcaine and  then placed an impervious dressing and closed it with 3-0 nylon sutures.  Impervious dressing applied.  The patient was then placed in a well-padded posterior  splint in about 70 degrees of flexion. The shoulder immobilizer was placed.  The patient  tolerated the procedure well without immediate complications.  Luke's assistance was required for retraction, opening, closing, mobilization of tissue.  His assistance was a medical  necessity.      PAA D: 07/11/2023 9:10:07 pm T: 07/11/2023 9:41:00 pm  JOB: [id number]/ [id number]

## 2023-07-24 ENCOUNTER — Telehealth: Payer: Self-pay | Admitting: Physical Medicine and Rehabilitation

## 2023-07-24 ENCOUNTER — Other Ambulatory Visit: Payer: Self-pay

## 2023-07-24 DIAGNOSIS — I714 Abdominal aortic aneurysm, without rupture, unspecified: Secondary | ICD-10-CM

## 2023-07-24 NOTE — Telephone Encounter (Signed)
Spoke with patient and scheduled injection for 08/07/23. Patient aware driver needed

## 2023-07-24 NOTE — Telephone Encounter (Signed)
Patient called wanting an Cortizone shot in his back. CB#[phone]

## 2023-07-26 ENCOUNTER — Encounter: Payer: Self-pay | Admitting: Surgical

## 2023-07-26 ENCOUNTER — Ambulatory Visit: Payer: Medicare Other | Admitting: Surgical

## 2023-07-26 DIAGNOSIS — S59902A Unspecified injury of left elbow, initial encounter: Secondary | ICD-10-CM
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
## 2023-07-29 ENCOUNTER — Telehealth: Payer: Self-pay | Admitting: Physical Medicine and Rehabilitation

## 2023-07-29 NOTE — Telephone Encounter (Signed)
Patient returned call asked for a call back   the number to contact patient is [phone]

## 2023-07-30 NOTE — Telephone Encounter (Signed)
Patient scheduled for 08/07/23

## 2023-08-07 ENCOUNTER — Other Ambulatory Visit: Payer: Self-pay

## 2023-08-07 ENCOUNTER — Ambulatory Visit (INDEPENDENT_AMBULATORY_CARE_PROVIDER_SITE_OTHER): Payer: Medicare Other | Admitting: Physical Medicine and Rehabilitation

## 2023-08-07 VITALS — BP 115/71 | HR 118

## 2023-08-07 DIAGNOSIS — M5416 Radiculopathy, lumbar region: Secondary | ICD-10-CM | POA: Diagnosis not present

## 2023-08-07 MED ORDER — METHYLPREDNISOLONE ACETATE 80 MG/ML IJ SUSP
80.0000 mg | Freq: Once | INTRAMUSCULAR | Status: AC
  Administered 2023-08-07: 80 mg

## 2023-08-07 NOTE — Progress Notes (Signed)
Anthony Summers - 67 y.o. male MRN [id number]  Date of birth: 1956-12-08  Office Visit Note: Visit Date: 08/07/2023 PCP: Irena Reichmann, DO Referred by: Irena Reichmann, DO  Subjective: Chief Complaint  Patient presents with   Lower Back - Pain   HPI:  [name] is a 67 y.o. male who comes in today for planned repeat Right L4-5  Lumbar Transforaminal epidural steroid injection with fluoroscopic guidance.  The patient has failed conservative care including home exercise, medications, time and activity modification.  This injection will be diagnostic and hopefully therapeutic.  Please see requesting physician notes for further details and justification. Patient received more than 50% pain relief from prior injection.   Consider L5-S1 interlaminar epidural.  Referring: Ellin Goodie, FNP    ROS Otherwise per HPI.  Assessment & Plan: Visit Diagnoses:    ICD-10-CM   1. Lumbar radiculopathy  M54.16 XR C-ARM NO REPORT    Epidural Steroid injection    methylPREDNISolone acetate (DEPO-MEDROL) injection 80 mg      Plan: No additional findings.   Meds & Orders:  Meds ordered this encounter  Medications   methylPREDNISolone acetate (DEPO-MEDROL) injection 80 mg    Orders Placed This Encounter  Procedures   XR C-ARM NO REPORT   Epidural Steroid injection    Follow-up: Return for visit to requesting provider as needed.   Procedures: No procedures performed  Lumbosacral Transforaminal Epidural Steroid Injection - Sub-Pedicular Approach with Fluoroscopic Guidance  Patient: Anthony Summers      Date of Birth: 1956-12-16 MRN: [id number] PCP: Irena Reichmann, DO      Visit Date: 08/07/2023   Universal Protocol:    Date/Time: 08/07/2023  Consent Given By: the patient  Position: PRONE  Additional Comments: Vital signs were monitored before and after the procedure. Patient was prepped and draped in the usual sterile fashion. The correct patient, procedure, and site  was verified.   Injection Procedure Details:   Procedure diagnoses: Lumbar radiculopathy [M54.16]    Meds Administered:  Meds ordered this encounter  Medications   methylPREDNISolone acetate (DEPO-MEDROL) injection 80 mg    Laterality: Right  Location/Site: L4  Needle:5.0 in., 22 ga.  Short bevel or Quincke spinal needle  Needle Placement: Transforaminal  Findings:    -Comments: Excellent flow of contrast along the nerve, nerve root and into the epidural space.  Procedure Details: After squaring off the end-plates to get a true AP view, the C-arm was positioned so that an oblique view of the foramen as noted above was visualized. The target area is just inferior to the "nose of the scotty dog" or sub pedicular. The soft tissues overlying this structure were infiltrated with 2-3 ml. of 1% Lidocaine without Epinephrine.  The spinal needle was inserted toward the target using a "trajectory" view along the fluoroscope beam.  Under AP and lateral visualization, the needle was advanced so it did not puncture dura and was located close the 6 O'Clock position of the pedical in AP tracterory. Biplanar projections were used to confirm position. Aspiration was confirmed to be negative for CSF and/or blood. A 1-2 ml. volume of Isovue-250 was injected and flow of contrast was noted at each level. Radiographs were obtained for documentation purposes.   After attaining the desired flow of contrast documented above, a 0.5 to 1.0 ml test dose of 0.25% Marcaine was injected into each respective transforaminal space.  The patient was observed for 90 seconds post injection.  After no sensory deficits were  reported, and normal lower extremity motor function was noted,   the above injectate was administered so that equal amounts of the injectate were placed at each foramen (level) into the transforaminal epidural space.   Additional Comments:  No complications occurred Dressing: 2 x 2 sterile gauze and  Band-Aid    Post-procedure details: Patient was observed during the procedure. Post-procedure instructions were reviewed.  Patient left the clinic in stable condition.    Clinical History: CLINICAL DATA:  Low back pain and radiculopathy.   EXAM: MRI LUMBAR SPINE WITHOUT CONTRAST   TECHNIQUE: Multiplanar, multisequence MR imaging of the lumbar spine was performed. No intravenous contrast was administered.   COMPARISON:  Two views lumbar spine 01/23/2023. CTA abdomen and pelvis 03/14/2022.   FINDINGS: Segmentation:  Standard.   Alignment:  Maintained.   Vertebrae: No fracture, evidence of discitis, or bone lesion. Scattered degenerative endplate signal change is most notable L5-S1   Conus medullaris and cauda equina: Conus extends to the L1-2 level. Conus and cauda equina appear normal.   Paraspinal and other soft tissues: 2 small right renal cysts are unchanged.   Disc levels:   T11-12 is imaged in the sagittal plane only. There is a shallow disc bulge to the right but no stenosis.   T12-L1: Negative.   L1-2: Minimal disc bulge without stenosis.   L2-3: Shallow disc bulge without stenosis.   L3-4: Shallow disc bulge. The central canal is open. Mild foraminal narrowing is more notable on the left.   L4-5: Shallow disc bulge with a superimposed right foraminal protrusion. There is severe right foraminal stenosis with encroachment on the exiting right L4 root. The central canal and left foramen are open.   L5-S1: Shallow disc bulge without stenosis.   IMPRESSION: 1. Right foraminal protrusion at L4-5 causes severe right foraminal stenosis and encroachment on the exiting right L4 root. 2. Mild foraminal narrowing at L3-4 is more notable on the left.     Electronically Signed   By: Drusilla Kanner M.D.   On: 03/08/2023 10:18     Objective:  VS:  HT:    WT:   BMI:     BP:115/71  HR:(!) 118bpm  TEMP: ( )  RESP:  Physical Exam Vitals and nursing  note reviewed.  Constitutional:      General: He is not in acute distress.    Appearance: Normal appearance. He is well-developed. He is not ill-appearing.  HENT:     Head: Normocephalic and atraumatic.     Right Ear: External ear normal.     Left Ear: External ear normal.     Nose: No congestion.  Eyes:     Extraocular Movements: Extraocular movements intact.     Conjunctiva/sclera: Conjunctivae normal.     Pupils: Pupils are equal, round, and reactive to light.  Cardiovascular:     Rate and Rhythm: Normal rate.     Pulses: Normal pulses.     Heart sounds: Normal heart sounds.  Pulmonary:     Effort: Pulmonary effort is normal. No respiratory distress.  Abdominal:     General: There is no distension.     Palpations: Abdomen is soft.  Musculoskeletal:        General: No tenderness or signs of injury.     Cervical back: Normal range of motion and neck supple. No rigidity.     Right lower leg: No edema.     Left lower leg: No edema.     Comments: Patient has good  distal strength without clonus.  Skin:    General: Skin is warm and dry.     Findings: No erythema or rash.  Neurological:     General: No focal deficit present.     Mental Status: He is alert and oriented to person, place, and time.     Sensory: No sensory deficit.     Motor: No weakness or abnormal muscle tone.     Coordination: Coordination normal.     Gait: Gait normal.  Psychiatric:        Mood and Affect: Mood normal.        Behavior: Behavior normal.      Imaging: XR Elbow 2 Views Left  Result Date: 08/19/2023 AP lateral radiographs left elbow reviewed.  Hardware in good position on the proximal ulna.  Joint is located.  No evidence of lucencies around the screws or change in hardware position.

## 2023-08-07 NOTE — Patient Instructions (Signed)

## 2023-08-07 NOTE — Progress Notes (Signed)
Functional Pain Scale - descriptive words and definitions  Uncomfortable (3)  Pain is present but can complete all ADL's/sleep is slightly affected and passive distraction only gives marginal relief. Mild range order  Average Pain  varies   +Driver, -BT, -Dye Allergies.  Lower back pain on right side that radiates into right leg

## 2023-08-19 ENCOUNTER — Other Ambulatory Visit (INDEPENDENT_AMBULATORY_CARE_PROVIDER_SITE_OTHER): Payer: Medicare Other

## 2023-08-19 ENCOUNTER — Telehealth: Payer: Self-pay | Admitting: Physical Medicine and Rehabilitation

## 2023-08-19 ENCOUNTER — Ambulatory Visit (INDEPENDENT_AMBULATORY_CARE_PROVIDER_SITE_OTHER): Payer: Medicare Other | Admitting: Orthopedic Surgery

## 2023-08-19 ENCOUNTER — Encounter: Payer: Self-pay | Admitting: Orthopedic Surgery

## 2023-08-19 DIAGNOSIS — M25522 Pain in left elbow: Secondary | ICD-10-CM | POA: Diagnosis not present

## 2023-08-19 DIAGNOSIS — S59902A Unspecified injury of left elbow, initial encounter: Secondary | ICD-10-CM
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
## 2023-08-19 NOTE — Telephone Encounter (Signed)
Pt would like a call to set and appt about a pinched nerve please advise

## 2023-08-19 NOTE — Procedures (Signed)
Lumbosacral Transforaminal Epidural Steroid Injection - Sub-Pedicular Approach with Fluoroscopic Guidance  Patient: Anthony Summers      Date of Birth: [birth year] MRN: [id number] PCP: Irena Reichmann, DO      Visit Date: 08/07/2023   Universal Protocol:    Date/Time: 08/07/2023  Consent Given By: the patient  Position: PRONE  Additional Comments: Vital signs were monitored before and after the procedure. Patient was prepped and draped in the usual sterile fashion. The correct patient, procedure, and site was verified.   Injection Procedure Details:   Procedure diagnoses: Lumbar radiculopathy [M54.16]    Meds Administered:  Meds ordered this encounter  Medications   methylPREDNISolone acetate (DEPO-MEDROL) injection 80 mg    Laterality: Right  Location/Site: L4  Needle:5.0 in., 22 ga.  Short bevel or Quincke spinal needle  Needle Placement: Transforaminal  Findings:    -Comments: Excellent flow of contrast along the nerve, nerve root and into the epidural space.  Procedure Details: After squaring off the end-plates to get a true AP view, the C-arm was positioned so that an oblique view of the foramen as noted above was visualized. The target area is just inferior to the "nose of the scotty dog" or sub pedicular. The soft tissues overlying this structure were infiltrated with 2-3 ml. of 1% Lidocaine without Epinephrine.  The spinal needle was inserted toward the target using a "trajectory" view along the fluoroscope beam.  Under AP and lateral visualization, the needle was advanced so it did not puncture dura and was located close the 6 O'Clock position of the pedical in AP tracterory. Biplanar projections were used to confirm position. Aspiration was confirmed to be negative for CSF and/or blood. A 1-2 ml. volume of Isovue-250 was injected and flow of contrast was noted at each level. Radiographs were obtained for documentation purposes.   After attaining the desired flow  of contrast documented above, a 0.5 to 1.0 ml test dose of 0.25% Marcaine was injected into each respective transforaminal space.  The patient was observed for 90 seconds post injection.  After no sensory deficits were reported, and normal lower extremity motor function was noted,   the above injectate was administered so that equal amounts of the injectate were placed at each foramen (level) into the transforaminal epidural space.   Additional Comments:  No complications occurred Dressing: 2 x 2 sterile gauze and Band-Aid    Post-procedure details: Patient was observed during the procedure. Post-procedure instructions were reviewed.  Patient left the clinic in stable condition.

## 2023-08-21 ENCOUNTER — Telehealth: Payer: Self-pay | Admitting: Physical Medicine and Rehabilitation

## 2023-08-21 NOTE — Telephone Encounter (Signed)
See previous encounter

## 2023-08-21 NOTE — Telephone Encounter (Signed)
Spoke with patient and he stated the injection from 08/07/23 did not work at all. He has "an aggravating" pain level of 4 with radiation in the right leg. He would like to know what to do. Please advise

## 2023-08-21 NOTE — Telephone Encounter (Signed)
Patient called needing to schedule an appointment with Dr. Alvester Morin for his back. The number to contact patient is [phone]

## 2023-08-22 ENCOUNTER — Encounter: Payer: Self-pay | Admitting: Physical Medicine and Rehabilitation

## 2023-08-22 ENCOUNTER — Ambulatory Visit: Payer: Medicare Other | Admitting: Physical Medicine and Rehabilitation

## 2023-08-22 VITALS — BP 124/77 | HR 91

## 2023-08-22 DIAGNOSIS — G894 Chronic pain syndrome: Secondary | ICD-10-CM

## 2023-08-22 DIAGNOSIS — M5416 Radiculopathy, lumbar region: Secondary | ICD-10-CM

## 2023-08-22 DIAGNOSIS — M48061 Spinal stenosis, lumbar region without neurogenic claudication: Secondary | ICD-10-CM

## 2023-08-22 DIAGNOSIS — G8929 Other chronic pain: Secondary | ICD-10-CM

## 2023-08-22 DIAGNOSIS — M5441 Lumbago with sciatica, right side: Secondary | ICD-10-CM | POA: Diagnosis not present

## 2023-08-22 MED ORDER — OXYCODONE HCL 5 MG PO TABS: 5.0000 mg | ORAL_TABLET | Freq: Three times a day (TID) | ORAL | 0 refills | Status: DC | PRN
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
## 2023-08-22 NOTE — Telephone Encounter (Signed)
scheduled

## 2023-08-23 ENCOUNTER — Telehealth: Payer: Self-pay

## 2023-08-23 ENCOUNTER — Ambulatory Visit (HOSPITAL_COMMUNITY): Payer: Medicare Other | Attending: Cardiology

## 2023-08-23 DIAGNOSIS — I77810 Thoracic aortic ectasia: Secondary | ICD-10-CM | POA: Diagnosis not present

## 2023-08-23 DIAGNOSIS — I2584 Coronary atherosclerosis due to calcified coronary lesion: Secondary | ICD-10-CM

## 2023-08-23 DIAGNOSIS — I10 Essential (primary) hypertension: Secondary | ICD-10-CM

## 2023-08-23 DIAGNOSIS — I493 Ventricular premature depolarization: Secondary | ICD-10-CM

## 2023-08-23 DIAGNOSIS — I2510 Atherosclerotic heart disease of native coronary artery without angina pectoris: Secondary | ICD-10-CM | POA: Diagnosis not present

## 2023-08-23 DIAGNOSIS — E785 Hyperlipidemia, unspecified: Secondary | ICD-10-CM

## 2023-08-23 DIAGNOSIS — I700 Atherosclerosis of aorta: Secondary | ICD-10-CM | POA: Diagnosis not present

## 2023-08-23 LAB — ECHOCARDIOGRAM COMPLETE
Area-P 1/2: 4.44 cm2
Est EF: >75
S' Lateral: 3.5 cm

## 2023-08-23 NOTE — Telephone Encounter (Signed)
Spoke with patient and he is aware of echo results. He verbalized understanding. No questions at this time.   He did not have labs drawn at his visit with PCP. He states he was not hydrated enough and the lab was trying to go fishing in his arm and he was not ok with that.

## 2023-08-23 NOTE — Telephone Encounter (Signed)
-----   Message from Laurann Montana sent at 08/23/2023  3:52 PM EDT ----- Please let pt know that echo overall reassuring. Pump function is high end of normal. Not acutely worried about this, would be more worrisome if low. We did the study to f/u size of aorta. This shows the aortic root is only mildly dilated. Overall good result. At our OV in July he was going to have labs by PCP the following day - please get a copy scanned into Labs tab. Specifically looking to see if he had BMET, TSH in particular, given h/o PVCs.Thank you!

## 2023-08-27 ENCOUNTER — Other Ambulatory Visit: Payer: Self-pay | Admitting: *Deleted

## 2023-08-27 DIAGNOSIS — E876 Hypokalemia: Secondary | ICD-10-CM

## 2023-08-27 DIAGNOSIS — Z79899 Other long term (current) drug therapy: Secondary | ICD-10-CM

## 2023-08-27 DIAGNOSIS — I493 Ventricular premature depolarization: Secondary | ICD-10-CM

## 2023-08-27 DIAGNOSIS — I10 Essential (primary) hypertension: Secondary | ICD-10-CM

## 2023-08-28 ENCOUNTER — Ambulatory Visit: Payer: Medicare Other | Attending: Physician Assistant

## 2023-08-28 ENCOUNTER — Telehealth: Payer: Self-pay

## 2023-08-28 ENCOUNTER — Other Ambulatory Visit: Payer: Self-pay | Admitting: Physical Medicine and Rehabilitation

## 2023-08-28 DIAGNOSIS — M5416 Radiculopathy, lumbar region: Secondary | ICD-10-CM

## 2023-08-28 DIAGNOSIS — G894 Chronic pain syndrome: Secondary | ICD-10-CM

## 2023-08-28 DIAGNOSIS — I493 Ventricular premature depolarization: Secondary | ICD-10-CM

## 2023-08-28 DIAGNOSIS — Z79899 Other long term (current) drug therapy: Secondary | ICD-10-CM

## 2023-08-28 DIAGNOSIS — E876 Hypokalemia: Secondary | ICD-10-CM

## 2023-08-28 DIAGNOSIS — I10 Essential (primary) hypertension: Secondary | ICD-10-CM

## 2023-08-28 NOTE — Telephone Encounter (Signed)
Patient called in asking if he is going to get a referral to pain management or if he needs to call in. Please advise

## 2023-08-29 LAB — BASIC METABOLIC PANEL
BUN/Creatinine Ratio: 27 — ABNORMAL HIGH (ref 10–24)
BUN: 16 mg/dL (ref 8–27)
CO2: 26 mmol/L (ref 20–29)
Calcium: 9.6 mg/dL (ref 8.6–10.2)
Chloride: 95 mmol/L — ABNORMAL LOW (ref 96–106)
Creatinine, Ser: 0.59 mg/dL — ABNORMAL LOW (ref 0.76–1.27)
Glucose: 105 mg/dL — ABNORMAL HIGH (ref 70–99)
Potassium: 4 mmol/L (ref 3.5–5.2)
Sodium: 135 mmol/L (ref 134–144)
eGFR: 106 mL/min/{1.73_m2} (ref >59)

## 2023-08-29 LAB — MAGNESIUM: Magnesium: 1.9 mg/dL (ref 1.6–2.3)

## 2023-08-29 LAB — TSH: TSH: 3.41 u[IU]/mL (ref 0.450–4.500)

## 2023-09-12 LAB — LAB REPORT - SCANNED
A1c: 5.7
EGFR: 104

## 2023-09-19 ENCOUNTER — Other Ambulatory Visit: Payer: Self-pay | Admitting: Neurological Surgery

## 2023-09-24 ENCOUNTER — Other Ambulatory Visit: Payer: Self-pay | Admitting: Neurological Surgery

## 2023-09-30 ENCOUNTER — Other Ambulatory Visit (INDEPENDENT_AMBULATORY_CARE_PROVIDER_SITE_OTHER): Payer: Medicare Other

## 2023-09-30 ENCOUNTER — Ambulatory Visit: Payer: Medicare Other | Admitting: Orthopedic Surgery

## 2023-09-30 DIAGNOSIS — M25522 Pain in left elbow: Secondary | ICD-10-CM

## 2023-09-30 DIAGNOSIS — Z9889 Other specified postprocedural states: Secondary | ICD-10-CM

## 2023-09-30 DIAGNOSIS — Z8781 Personal history of (healed) traumatic fracture: Secondary | ICD-10-CM

## 2023-10-01 ENCOUNTER — Encounter: Payer: Self-pay | Admitting: Orthopedic Surgery
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
## 2023-10-02 NOTE — Progress Notes (Signed)
Surgical Instructions   Your procedure is scheduled on Friday October 11, 2023. Report to [facility] Main Entrance "A" at 11:00 A.M., then check in with the Admitting office. Any questions or running late day of surgery: call [phone]  Questions prior to your surgery date: call [phone], Monday-Friday, 8am-4pm. If you experience any cold or flu symptoms such as cough, fever, chills, shortness of breath, etc. between now and your scheduled surgery, please notify us at the above number.     Remember:  Do not eat after midnight the night before your surgery  You may drink clear liquids until 10:00 the morning of your surgery.   Clear liquids allowed are: Water, Non-Citrus Juices (without pulp), Carbonated Beverages, Clear Tea, Black Coffee Only (NO MILK, CREAM OR POWDERED CREAMER of any kind), and Gatorade.    Take these medicines the morning of surgery with A SIP OF WATER  ezetimibe (ZETIA)  gabapentin (NEURONTIN)  rosuvastatin (CRESTOR)  White Petrolatum-Mineral Oil (REFRESH LACRI-LUBE OP)   May take these medicines IF NEEDED: albuterol (VENTOLIN HFA) 108 (90 Base) MCG/ACT inhaler.  Please bring inhaler with you to the hospital.  ALPRAZolam Prudy Feeler)  fluticasone [facility])     Follow your surgeon's instructions on when to stop Asprin.  If no instructions were given by your surgeon then you will need to call the office to get those instructions.      One week prior to surgery, STOP taking any Aleve, Naproxen, Ibuprofen, Motrin, Advil, Goody's, BC's, all herbal medications, fish oil, and non-prescription vitamins.                     Do NOT Smoke (Tobacco/Vaping) for 24 hours prior to your procedure.  If you use a CPAP at night, you may bring your mask/headgear for your overnight stay.   You will be asked to remove any contacts, glasses, piercing's, hearing aid's, dentures/partials prior to surgery. Please bring cases for these items if needed.    Patients discharged the  day of surgery will not be allowed to drive home, and someone needs to stay with them for 24 hours.  SURGICAL WAITING ROOM VISITATION Patients may have no more than 2 support people in the waiting area - these visitors may rotate.   Pre-op nurse will coordinate an appropriate time for 1 ADULT support person, who may not rotate, to accompany patient in pre-op.  Children under the age of 8 must have an adult with them who is not the patient and must remain in the main waiting area with an adult.  If the patient needs to stay at the hospital during part of their recovery, the visitor guidelines for inpatient rooms apply.  Please refer to the [facility] website for the visitor guidelines for any additional information.   If you received a COVID test during your pre-op visit  it is requested that you wear a mask when out in public, stay away from anyone that may not be feeling well and notify your surgeon if you develop symptoms. If you have been in contact with anyone that has tested positive in the last 10 days please notify you surgeon.      Pre-operative 5 CHG Bathing Instructions   You can play a key role in reducing the risk of infection after surgery. Your skin needs to be as free of germs as possible. You can reduce the number of germs on your skin by washing with CHG (chlorhexidine gluconate) soap before surgery. CHG is an  antiseptic soap that kills germs and continues to kill germs even after washing.   DO NOT use if you have an allergy to chlorhexidine/CHG or antibacterial soaps. If your skin becomes reddened or irritated, stop using the CHG and notify one of our RNs at [phone].   Please shower with the CHG soap starting 4 days before surgery using the following schedule:     Please keep in mind the following:  DO NOT shave, including legs and underarms, starting the day of your first shower.   You may shave your face at any point before/day of surgery.  Place clean sheets  on your bed the day you start using CHG soap. Use a clean washcloth (not used since being washed) for each shower. DO NOT sleep with pets once you start using the CHG.   CHG Shower Instructions:  Wash your face and private area with normal soap. If you choose to wash your hair, wash first with your normal shampoo.  After you use shampoo/soap, rinse your hair and body thoroughly to remove shampoo/soap residue.  Turn the water OFF and apply about 3 tablespoons (45 ml) of CHG soap to a CLEAN washcloth.  Apply CHG soap ONLY FROM YOUR NECK DOWN TO YOUR TOES (washing for 3-5 minutes)  DO NOT use CHG soap on face, private areas, open wounds, or sores.  Pay special attention to the area where your surgery is being performed.  If you are having back surgery, having someone wash your back for you may be helpful. Wait 2 minutes after CHG soap is applied, then you may rinse off the CHG soap.  Pat dry with a clean towel  Put on clean clothes/pajamas   If you choose to wear lotion, please use ONLY the CHG-compatible lotions on the back of this paper.   Additional instructions for the day of surgery: DO NOT APPLY any lotions, deodorants or cologne.  Do not bring valuables to the hospital. [facility] is not responsible for any belongings/valuables. Do not wear jewelry or makeup Put on clean/comfortable clothes.  Please brush your teeth.  Ask your nurse before applying any prescription medications to the skin.     CHG Compatible Lotions   Aveeno Moisturizing lotion  Cetaphil Moisturizing Cream  Cetaphil Moisturizing Lotion  Clairol Herbal Essence Moisturizing Lotion, Dry Skin  Clairol Herbal Essence Moisturizing Lotion, Extra Dry Skin  Clairol Herbal Essence Moisturizing Lotion, Normal Skin  Curel Age Defying Therapeutic Moisturizing Lotion with Alpha Hydroxy  Curel Extreme Care Body Lotion  Curel Soothing Hands Moisturizing Hand Lotion  Curel Therapeutic Moisturizing Cream, Fragrance-Free   Curel Therapeutic Moisturizing Lotion, Fragrance-Free  Curel Therapeutic Moisturizing Lotion, Original Formula  Eucerin Daily Replenishing Lotion  Eucerin Dry Skin Therapy Plus Alpha Hydroxy Crme  Eucerin Dry Skin Therapy Plus Alpha Hydroxy Lotion  Eucerin Original Crme  Eucerin Original Lotion  Eucerin Plus Crme Eucerin Plus Lotion  Eucerin TriLipid Replenishing Lotion  Keri Anti-Bacterial Hand Lotion  Keri Deep Conditioning Original Lotion Dry Skin Formula Softly Scented  Keri Deep Conditioning Original Lotion, Fragrance Free Sensitive Skin Formula  Keri Lotion Fast Absorbing Fragrance Free Sensitive Skin Formula  Keri Lotion Fast Absorbing Softly Scented Dry Skin Formula  Keri Original Lotion  Keri Skin Renewal Lotion Keri Silky Smooth Lotion  Keri Silky Smooth Sensitive Skin Lotion  Nivea Body Creamy Conditioning Oil  Nivea Body Extra Enriched Teacher, adult education Moisturizing Lotion Nivea Crme  Nivea Skin Firming Lotion  NutraDerm  30 Skin Lotion  NutraDerm Skin Lotion  NutraDerm Therapeutic Skin Cream  NutraDerm Therapeutic Skin Lotion  ProShield Protective Hand Cream  Provon moisturizing lotion  Please read over the following fact sheets that you were given.

## 2023-10-03 ENCOUNTER — Encounter (HOSPITAL_COMMUNITY)
Admission: RE | Admit: 2023-10-03 | Discharge: 2023-10-03 | Disposition: A | Discharge status: Home / Self Care | Payer: Medicare Other | Source: Ambulatory Visit | Attending: Neurological Surgery | Admitting: Neurological Surgery

## 2023-10-03 ENCOUNTER — Encounter (HOSPITAL_COMMUNITY): Payer: Self-pay

## 2023-10-03 ENCOUNTER — Other Ambulatory Visit: Payer: Self-pay

## 2023-10-03 VITALS — BP 126/76 | HR 93 | Temp 98.3°F | Resp 18 | Ht 73.0 in | Wt 188.4 lb

## 2023-10-03 DIAGNOSIS — E785 Hyperlipidemia, unspecified: Secondary | ICD-10-CM | POA: Diagnosis not present

## 2023-10-03 DIAGNOSIS — Z01812 Encounter for preprocedural laboratory examination: Secondary | ICD-10-CM | POA: Insufficient documentation

## 2023-10-03 DIAGNOSIS — I714 Abdominal aortic aneurysm, without rupture, unspecified: Secondary | ICD-10-CM | POA: Insufficient documentation

## 2023-10-03 DIAGNOSIS — J449 Chronic obstructive pulmonary disease, unspecified: Secondary | ICD-10-CM | POA: Insufficient documentation

## 2023-10-03 DIAGNOSIS — M5126 Other intervertebral disc displacement, lumbar region: Secondary | ICD-10-CM | POA: Insufficient documentation

## 2023-10-03 DIAGNOSIS — Z8546 Personal history of malignant neoplasm of prostate: Secondary | ICD-10-CM | POA: Diagnosis not present

## 2023-10-03 DIAGNOSIS — I2510 Atherosclerotic heart disease of native coronary artery without angina pectoris: Secondary | ICD-10-CM | POA: Insufficient documentation

## 2023-10-03 DIAGNOSIS — I10 Essential (primary) hypertension: Secondary | ICD-10-CM | POA: Diagnosis not present

## 2023-10-03 DIAGNOSIS — Z01818 Encounter for other preprocedural examination: Secondary | ICD-10-CM

## 2023-10-03 LAB — SURGICAL PCR SCREEN
MRSA, PCR: NEGATIVE
Staphylococcus aureus: NEGATIVE

## 2023-10-03 LAB — BASIC METABOLIC PANEL
Anion gap: 13 (ref 5–15)
BUN: 9 mg/dL (ref 8–23)
CO2: 25 mmol/L (ref 22–32)
Calcium: 8.9 mg/dL (ref 8.9–10.3)
Chloride: 101 mmol/L (ref 98–111)
Creatinine, Ser: 0.82 mg/dL (ref 0.61–1.24)
GFR, Estimated: >60 mL/min (ref >60)
Glucose, Bld: 103 mg/dL — ABNORMAL HIGH (ref 70–99)
Potassium: 3.9 mmol/L (ref 3.5–5.1)
Sodium: 139 mmol/L (ref 135–145)

## 2023-10-03 LAB — CBC
HCT: 40.5 % (ref 39.0–52.0)
Hemoglobin: 14.4 g/dL (ref 13.0–17.0)
MCH: 35.6 pg — ABNORMAL HIGH (ref 26.0–34.0)
MCHC: 35.6 g/dL (ref 30.0–36.0)
MCV: 100.2 fL — ABNORMAL HIGH (ref 80.0–100.0)
Platelets: 193 10*3/uL (ref 150–400)
RBC: 4.04 MIL/uL — ABNORMAL LOW (ref 4.22–5.81)
RDW: 14.6 % (ref 11.5–15.5)
WBC: 5.2 10*3/uL (ref 4.0–10.5)
nRBC: 0 % (ref 0.0–0.2)

## 2023-10-03 NOTE — Progress Notes (Signed)
PCP - dana collins Cardiologist - Chandrasekhar  PPM/ICD - denies   Chest x-ray - n/a EKG - 07/15/23 Stress Test - 10/2022 ECHO - 08/23/23 Cardiac Cath - denies  Sleep Study - denies  7 days prior to surgery STOP taking any Aspirin (unless otherwise instructed by your surgeon), Aleve, Naproxen, Ibuprofen, Motrin, Advil, Goody's, BC's, all herbal medications, fish oil, and all vitamins.   ERAS Protcol -yes PRE-SURGERY Ensure or G2- none ordered  COVID TEST- not needed   Anesthesia review: yes, cardiac history of AAA repair, carotid stenosis  Patient denies shortness of breath, fever, cough and chest pain at PAT appointment   All instructions explained to the patient, with a verbal understanding of the material. Patient agrees to go over the instructions while at home for a better understanding. Patient also instructed to self quarantine after being tested for COVID-19. The opportunity to ask questions was provided.
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
## 2023-10-04 NOTE — Anesthesia Preprocedure Evaluation (Addendum)
Anesthesia Evaluation  Patient identified by MRN, date of birth, ID band Patient awake    Reviewed: Allergy & Precautions, H&P , NPO status , Patient's Chart, lab work & pertinent test results  Airway Mallampati: II   Neck ROM: full    Dental   Pulmonary COPD, Current Smoker and Patient abstained from smoking.   breath sounds clear to auscultation       Cardiovascular hypertension, + CAD and + Peripheral Vascular Disease   Rhythm:regular Rate:Normal     Neuro/Psych  PSYCHIATRIC DISORDERS Anxiety Depression       GI/Hepatic   Endo/Other    Renal/GU      Musculoskeletal   Abdominal   Peds  Hematology   Anesthesia Other Findings   Reproductive/Obstetrics                             Anesthesia Physical Anesthesia Plan  ASA: 3  Anesthesia Plan: General   Post-op Pain Management:    Induction: Intravenous  PONV Risk Score and Plan: 1 and Ondansetron, Dexamethasone, Midazolam and Treatment may vary due to age or medical condition  Airway Management Planned: Oral ETT  Additional Equipment:   Intra-op Plan:   Post-operative Plan: Extubation in OR  Informed Consent: I have reviewed the patients History and Physical, chart, labs and discussed the procedure including the risks, benefits and alternatives for the proposed anesthesia with the patient or authorized representative who has indicated his/her understanding and acceptance.     Dental advisory given  Plan Discussed with: CRNA, Anesthesiologist and Surgeon  Anesthesia Plan Comments: (PAT note written 10/04/2023 by Shonna Chock, PA-C.  )       Anesthesia Quick Evaluation

## 2023-10-11 ENCOUNTER — Ambulatory Visit (HOSPITAL_BASED_OUTPATIENT_CLINIC_OR_DEPARTMENT_OTHER): Payer: Medicare Other | Admitting: Anesthesiology

## 2023-10-11 ENCOUNTER — Encounter (HOSPITAL_COMMUNITY)
Admission: RE | Disposition: A | Discharge status: Home / Self Care | Payer: Self-pay | Source: Home / Self Care | Attending: Neurological Surgery

## 2023-10-11 ENCOUNTER — Ambulatory Visit (HOSPITAL_COMMUNITY): Payer: Medicare Other | Admitting: Physician Assistant

## 2023-10-11 ENCOUNTER — Ambulatory Visit (HOSPITAL_COMMUNITY)
Admission: RE | Admit: 2023-10-11 | Discharge: 2023-10-11 | Disposition: A | Discharge status: Home / Self Care | Payer: Medicare Other | Attending: Neurological Surgery | Admitting: Neurological Surgery

## 2023-10-11 ENCOUNTER — Encounter (HOSPITAL_COMMUNITY): Payer: Self-pay | Admitting: Neurological Surgery

## 2023-10-11 ENCOUNTER — Other Ambulatory Visit: Payer: Self-pay

## 2023-10-11 ENCOUNTER — Ambulatory Visit (HOSPITAL_COMMUNITY): Payer: Medicare Other

## 2023-10-11 DIAGNOSIS — M5116 Intervertebral disc disorders with radiculopathy, lumbar region: Secondary | ICD-10-CM | POA: Insufficient documentation

## 2023-10-11 DIAGNOSIS — J449 Chronic obstructive pulmonary disease, unspecified: Secondary | ICD-10-CM | POA: Diagnosis not present

## 2023-10-11 DIAGNOSIS — F17210 Nicotine dependence, cigarettes, uncomplicated: Secondary | ICD-10-CM | POA: Diagnosis not present

## 2023-10-11 DIAGNOSIS — I739 Peripheral vascular disease, unspecified: Secondary | ICD-10-CM | POA: Insufficient documentation

## 2023-10-11 DIAGNOSIS — Z79899 Other long term (current) drug therapy: Secondary | ICD-10-CM | POA: Diagnosis not present

## 2023-10-11 DIAGNOSIS — F419 Anxiety disorder, unspecified: Secondary | ICD-10-CM | POA: Diagnosis not present

## 2023-10-11 DIAGNOSIS — I2510 Atherosclerotic heart disease of native coronary artery without angina pectoris: Secondary | ICD-10-CM | POA: Diagnosis not present

## 2023-10-11 DIAGNOSIS — Z7982 Long term (current) use of aspirin: Secondary | ICD-10-CM | POA: Diagnosis not present

## 2023-10-11 DIAGNOSIS — I10 Essential (primary) hypertension: Secondary | ICD-10-CM | POA: Insufficient documentation

## 2023-10-11 DIAGNOSIS — M5416 Radiculopathy, lumbar region: Secondary | ICD-10-CM | POA: Diagnosis not present

## 2023-10-11 DIAGNOSIS — Z8546 Personal history of malignant neoplasm of prostate: Secondary | ICD-10-CM | POA: Insufficient documentation

## 2023-10-11 DIAGNOSIS — I700 Atherosclerosis of aorta: Secondary | ICD-10-CM | POA: Insufficient documentation

## 2023-10-11 DIAGNOSIS — M48061 Spinal stenosis, lumbar region without neurogenic claudication: Secondary | ICD-10-CM | POA: Insufficient documentation

## 2023-10-11 DIAGNOSIS — F32A Depression, unspecified: Secondary | ICD-10-CM | POA: Diagnosis not present

## 2023-10-11 HISTORY — PX: LUMBAR LAMINECTOMY/ DECOMPRESSION WITH MET-RX: SHX5959

## 2023-10-11 SURGERY — LUMBAR LAMINECTOMY/ DECOMPRESSION WITH MET-RX
Anesthesia: General | Site: Spine Lumbar | Laterality: Right

## 2023-10-11 MED ORDER — BUPIVACAINE-EPINEPHRINE (PF) 0.5% -1:200000 IJ SOLN
INTRAMUSCULAR | Status: DC | PRN
  Administered 2023-10-11: 5 mL via PERINEURAL
  Administered 2023-10-11: 1.5 mL via PERINEURAL

## 2023-10-11 MED ORDER — ONDANSETRON HCL 4 MG/2ML IJ SOLN
INTRAMUSCULAR | Status: DC | PRN
  Administered 2023-10-11: 4 mg via INTRAVENOUS

## 2023-10-11 MED ORDER — EPHEDRINE SULFATE-NACL 50-0.9 MG/10ML-% IV SOSY
PREFILLED_SYRINGE | INTRAVENOUS | Status: DC | PRN
  Administered 2023-10-11: 10 mg via INTRAVENOUS

## 2023-10-11 MED ORDER — CEFAZOLIN SODIUM-DEXTROSE 2-4 GM/100ML-% IV SOLN
INTRAVENOUS | Status: AC
  Filled 2023-10-11: qty 100

## 2023-10-11 MED ORDER — PROPOFOL 10 MG/ML IV BOLUS
INTRAVENOUS | Status: DC | PRN
  Administered 2023-10-11: 170 mg via INTRAVENOUS

## 2023-10-11 MED ORDER — FENTANYL CITRATE (PF) 100 MCG/2ML IJ SOLN
INTRAMUSCULAR | Status: AC
  Filled 2023-10-11: qty 2

## 2023-10-11 MED ORDER — TIZANIDINE HCL 2 MG PO TABS: 2.0000 mg | ORAL_TABLET | Freq: Three times a day (TID) | ORAL | 1 refills | Status: DC | PRN

## 2023-10-11 MED ORDER — DEXAMETHASONE SODIUM PHOSPHATE 10 MG/ML IJ SOLN
INTRAMUSCULAR | Status: DC | PRN
  Administered 2023-10-11: 4 mg via INTRAVENOUS

## 2023-10-11 MED ORDER — CHLORHEXIDINE GLUCONATE 0.12 % MT SOLN
OROMUCOSAL | Status: AC
  Administered 2023-10-11: 15 mL via OROMUCOSAL
  Filled 2023-10-11: qty 15

## 2023-10-11 MED ORDER — CHLORHEXIDINE GLUCONATE CLOTH 2 % EX PADS: 6.0000 | MEDICATED_PAD | Freq: Once | CUTANEOUS | Status: DC

## 2023-10-11 MED ORDER — THROMBIN 5000 UNITS EX SOLR
OROMUCOSAL | Status: DC | PRN
  Administered 2023-10-11: 5 mL via TOPICAL

## 2023-10-11 MED ORDER — OXYCODONE HCL 5 MG/5ML PO SOLN: 5.0000 mg | Freq: Once | ORAL | Status: AC | PRN

## 2023-10-11 MED ORDER — LIDOCAINE-EPINEPHRINE 1 %-1:100000 IJ SOLN
INTRAMUSCULAR | Status: AC
  Filled 2023-10-11: qty 1

## 2023-10-11 MED ORDER — LACTATED RINGERS IV SOLN: INTRAVENOUS | Status: DC

## 2023-10-11 MED ORDER — BUPIVACAINE-EPINEPHRINE (PF) 0.5% -1:200000 IJ SOLN
INTRAMUSCULAR | Status: AC
  Filled 2023-10-11: qty 30

## 2023-10-11 MED ORDER — PROPOFOL 10 MG/ML IV BOLUS
INTRAVENOUS | Status: AC
  Filled 2023-10-11: qty 20

## 2023-10-11 MED ORDER — OXYCODONE HCL 5 MG PO TABS
5.0000 mg | ORAL_TABLET | Freq: Once | ORAL | Status: AC | PRN
  Administered 2023-10-11: 5 mg via ORAL

## 2023-10-11 MED ORDER — ROCURONIUM BROMIDE 10 MG/ML (PF) SYRINGE
PREFILLED_SYRINGE | INTRAVENOUS | Status: DC | PRN
  Administered 2023-10-11: 40 mg via INTRAVENOUS
  Administered 2023-10-11: 20 mg via INTRAVENOUS
  Administered 2023-10-11: 60 mg via INTRAVENOUS

## 2023-10-11 MED ORDER — LIDOCAINE-EPINEPHRINE 1 %-1:100000 IJ SOLN
INTRAMUSCULAR | Status: DC | PRN
  Administered 2023-10-11: 1.5 mL
  Administered 2023-10-11: 5 mL

## 2023-10-11 MED ORDER — LACTATED RINGERS IV SOLN: INTRAVENOUS | Status: DC | PRN

## 2023-10-11 MED ORDER — THROMBIN 5000 UNITS EX SOLR
CUTANEOUS | Status: AC
  Filled 2023-10-11: qty 5000

## 2023-10-11 MED ORDER — ASPIRIN 81 MG PO TBEC: 81.0000 mg | DELAYED_RELEASE_TABLET | Freq: Every day | ORAL | Status: AC

## 2023-10-11 MED ORDER — HYDROCODONE-ACETAMINOPHEN 5-325 MG PO TABS: 1.0000 | ORAL_TABLET | Freq: Four times a day (QID) | ORAL | 0 refills | Status: DC | PRN

## 2023-10-11 MED ORDER — CEFAZOLIN SODIUM-DEXTROSE 2-4 GM/100ML-% IV SOLN
2.0000 g | INTRAVENOUS | Status: AC
  Administered 2023-10-11: 2 g via INTRAVENOUS

## 2023-10-11 MED ORDER — OXYCODONE HCL 5 MG PO TABS
ORAL_TABLET | ORAL | Status: AC
  Filled 2023-10-11: qty 1

## 2023-10-11 MED ORDER — FENTANYL CITRATE (PF) 100 MCG/2ML IJ SOLN
25.0000 ug | INTRAMUSCULAR | Status: DC | PRN
  Administered 2023-10-11 (×3): 50 ug via INTRAVENOUS

## 2023-10-11 MED ORDER — MIDAZOLAM HCL 2 MG/2ML IJ SOLN
INTRAMUSCULAR | Status: DC | PRN
  Administered 2023-10-11: 2 mg via INTRAVENOUS

## 2023-10-11 MED ORDER — 0.9 % SODIUM CHLORIDE (POUR BTL) OPTIME
TOPICAL | Status: DC | PRN
  Administered 2023-10-11: 1000 mL

## 2023-10-11 MED ORDER — SUGAMMADEX SODIUM 200 MG/2ML IV SOLN
INTRAVENOUS | Status: DC | PRN
  Administered 2023-10-11: 200 mg via INTRAVENOUS

## 2023-10-11 MED ORDER — FENTANYL CITRATE (PF) 100 MCG/2ML IJ SOLN
INTRAMUSCULAR | Status: DC | PRN
  Administered 2023-10-11: 50 ug via INTRAVENOUS

## 2023-10-11 MED ORDER — FENTANYL CITRATE (PF) 250 MCG/5ML IJ SOLN
INTRAMUSCULAR | Status: DC | PRN
  Administered 2023-10-11: 100 ug via INTRAVENOUS
  Administered 2023-10-11: 50 ug via INTRAVENOUS
  Administered 2023-10-11: 100 ug via INTRAVENOUS

## 2023-10-11 MED ORDER — ONDANSETRON HCL 4 MG/2ML IJ SOLN: 4.0000 mg | Freq: Four times a day (QID) | INTRAMUSCULAR | Status: DC | PRN

## 2023-10-11 MED ORDER — DEXMEDETOMIDINE HCL IN NACL 80 MCG/20ML IV SOLN
INTRAVENOUS | Status: DC | PRN
Start: 2023-10-11 — End: 2023-10-11
  Administered 2023-10-11: 12 ug via INTRAVENOUS

## 2023-10-11 MED ORDER — METHYLPREDNISOLONE ACETATE 80 MG/ML IJ SUSP
INTRAMUSCULAR | Status: AC
  Filled 2023-10-11: qty 1

## 2023-10-11 MED ORDER — CHLORHEXIDINE GLUCONATE 0.12 % MT SOLN: 15.0000 mL | Freq: Once | OROMUCOSAL | Status: AC

## 2023-10-11 MED ORDER — MIDAZOLAM HCL 2 MG/2ML IJ SOLN
INTRAMUSCULAR | Status: AC
  Filled 2023-10-11: qty 2

## 2023-10-11 MED ORDER — FENTANYL CITRATE (PF) 250 MCG/5ML IJ SOLN
INTRAMUSCULAR | Status: AC
  Filled 2023-10-11: qty 5

## 2023-10-11 MED ORDER — METHYLPREDNISOLONE ACETATE 80 MG/ML IJ SUSP
INTRAMUSCULAR | Status: DC | PRN
  Administered 2023-10-11: 40 mg

## 2023-10-11 MED ORDER — ORAL CARE MOUTH RINSE: 15.0000 mL | Freq: Once | OROMUCOSAL | Status: AC

## 2023-10-11 SURGICAL SUPPLY — 62 items
ADH SKN CLS APL DERMABOND .7 (GAUZE/BANDAGES/DRESSINGS) ×1
BAG COUNTER SPONGE SURGICOUNT (BAG) ×2 IMPLANT
BAG SPNG CNTER NS LX DISP (BAG) ×1
BLADE SURG 11 STRL SS (BLADE) ×2 IMPLANT
BUR MATCHSTICK NEURO 3.0 LAGG (BURR) ×2 IMPLANT
CNTNR URN SCR LID CUP LEK RST (MISCELLANEOUS) IMPLANT
CONT SPEC 4OZ STRL OR WHT (MISCELLANEOUS)
COVER BACK TABLE 60X90IN (DRAPES) ×2 IMPLANT
COVER MAYO STAND STRL (DRAPES) ×2 IMPLANT
DERMABOND ADVANCED .7 DNX12 (GAUZE/BANDAGES/DRESSINGS) ×2 IMPLANT
DRAIN JACKSON RD 7FR 3/32 (WOUND CARE) IMPLANT
DRAPE C-ARM 42X72 X-RAY (DRAPES) ×2 IMPLANT
DRAPE LAPAROTOMY 100X72X124 (DRAPES) ×2 IMPLANT
DRAPE MICROSCOPE SLANT 54X150 (MISCELLANEOUS) ×2 IMPLANT
DRAPE SURG 17X23 STRL (DRAPES) ×2 IMPLANT
DRSG OPSITE 4X5.5 SM (GAUZE/BANDAGES/DRESSINGS) IMPLANT
DRSG OPSITE POSTOP 3X4 (GAUZE/BANDAGES/DRESSINGS) ×2 IMPLANT
DURAPREP 26ML APPLICATOR (WOUND CARE) ×2 IMPLANT
ELECT BLADE 6.5 EXT (BLADE) ×2 IMPLANT
ELECT BLADE INSULATED 6.5IN (ELECTROSURGICAL) ×1
ELECT COATED BLADE 2.86 ST (ELECTRODE) ×2 IMPLANT
ELECT REM PT RETURN 9FT ADLT (ELECTROSURGICAL) ×1
ELECTRODE BLDE INSULATED 6.5IN (ELECTROSURGICAL) ×2 IMPLANT
ELECTRODE REM PT RTRN 9FT ADLT (ELECTROSURGICAL) ×2 IMPLANT
GAUZE 4X4 16PLY ~~LOC~~+RFID DBL (SPONGE) IMPLANT
GLOVE BIO SURGEON STRL SZ7 (GLOVE) ×2 IMPLANT
GLOVE BIOGEL PI IND STRL 7.5 (GLOVE) ×2 IMPLANT
GLOVE BIOGEL PI IND STRL 8 (GLOVE) ×4 IMPLANT
GLOVE ECLIPSE 8.0 STRL XLNG CF (GLOVE) ×4 IMPLANT
GOWN STRL REUS W/ TWL LRG LVL3 (GOWN DISPOSABLE) IMPLANT
GOWN STRL REUS W/ TWL XL LVL3 (GOWN DISPOSABLE) ×4 IMPLANT
GOWN STRL REUS W/TWL 2XL LVL3 (GOWN DISPOSABLE) IMPLANT
GOWN STRL REUS W/TWL LRG LVL3 (GOWN DISPOSABLE)
GOWN STRL REUS W/TWL XL LVL3 (GOWN DISPOSABLE) ×2
HEMOSTAT POWDER KIT SURGIFOAM (HEMOSTASIS) ×2 IMPLANT
KIT BASIN OR (CUSTOM PROCEDURE TRAY) ×2 IMPLANT
KIT POSITION SURG JACKSON T1 (MISCELLANEOUS) ×2 IMPLANT
KIT TURNOVER KIT B (KITS) ×2 IMPLANT
MARKER SKIN DUAL TIP RULER LAB (MISCELLANEOUS) ×2 IMPLANT
NDL HYPO 25X1 1.5 SAFETY (NEEDLE) ×2 IMPLANT
NDL SPNL 18GX3.5 QUINCKE PK (NEEDLE) ×2 IMPLANT
NEEDLE HYPO 25X1 1.5 SAFETY (NEEDLE) ×1 IMPLANT
NEEDLE SPNL 18GX3.5 QUINCKE PK (NEEDLE) ×1 IMPLANT
NS IRRIG 1000ML POUR BTL (IV SOLUTION) ×2 IMPLANT
PACK LAMINECTOMY NEURO (CUSTOM PROCEDURE TRAY) ×2 IMPLANT
PAD ARMBOARD 7.5X6 YLW CONV (MISCELLANEOUS) ×6 IMPLANT
PATTIES SURGICAL .5 X.5 (GAUZE/BANDAGES/DRESSINGS) IMPLANT
PATTIES SURGICAL .5 X1 (DISPOSABLE) IMPLANT
PATTIES SURGICAL 1X1 (DISPOSABLE) IMPLANT
SPIKE FLUID TRANSFER (MISCELLANEOUS) ×2 IMPLANT
SPONGE SURGIFOAM ABS GEL 12-7 (HEMOSTASIS) ×2 IMPLANT
SPONGE T-LAP 4X18 ~~LOC~~+RFID (SPONGE) IMPLANT
STAPLER VISISTAT 35W (STAPLE) IMPLANT
SUT VIC AB 0 CT1 27 (SUTURE)
SUT VIC AB 0 CT1 27XBRD ANBCTR (SUTURE) IMPLANT
SUT VIC AB 2-0 CP2 18 (SUTURE) ×2 IMPLANT
SUT VIC AB 3-0 SH 8-18 (SUTURE) ×2 IMPLANT
SYR 5ML LUER SLIP (SYRINGE) IMPLANT
TOWEL GREEN STERILE (TOWEL DISPOSABLE) IMPLANT
TOWEL GREEN STERILE FF (TOWEL DISPOSABLE) IMPLANT
TRAY FOLEY MTR SLVR 16FR STAT (SET/KITS/TRAYS/PACK) IMPLANT
WATER STERILE IRR 1000ML POUR (IV SOLUTION) ×2 IMPLANT

## 2023-10-11 NOTE — Anesthesia Procedure Notes (Signed)
Procedure Name: Intubation Date/Time: 10/11/2023 5:01 PM  Performed by: Sandie Ano, CRNAPre-anesthesia Checklist: Patient identified, Emergency Drugs available, Suction available and Patient being monitored Patient Re-evaluated:Patient Re-evaluated prior to induction Oxygen Delivery Method: Circle System Utilized Preoxygenation: Pre-oxygenation with 100% oxygen Induction Type: IV induction Ventilation: Mask ventilation without difficulty Laryngoscope Size: Mac and 4 Grade View: Grade II Tube type: Oral Number of attempts: 1 Airway Equipment and Method: Stylet and Oral airway Placement Confirmation: ETT inserted through vocal cords under direct vision, positive ETCO2 and breath sounds checked- equal and bilateral Secured at: 23 cm Tube secured with: Tape Dental Injury: Teeth and Oropharynx as per pre-operative assessment

## 2023-10-11 NOTE — Transfer of Care (Signed)
Immediate Anesthesia Transfer of Care Note  Patient: Anthony Summers  Procedure(s) Performed: MIS FAR LATERAL AND MIDLINE LAM/MICRODISCECTOMY, LUMBAR FOUR-LUMBAR FIVE (Right: Spine Lumbar)  Patient Location: PACU  Anesthesia Type:General  Level of Consciousness: awake and alert   Airway & Oxygen Therapy: Patient Spontanous Breathing  Post-op Assessment: Report given to RN and Post -op Vital signs reviewed and stable  Post vital signs: Reviewed and stable  Last Vitals:  Vitals Value Taken Time  BP 146/80 10/11/23 1915  Temp    Pulse 97 10/11/23 1915  Resp 13 10/11/23 1915  SpO2 97 % 10/11/23 1915  Vitals shown include unfiled device data.  Last Pain:  Vitals:   10/11/23 1208  TempSrc:   PainSc: 3       Patients Stated Pain Goal: 3 (10/11/23 1208)  Complications: No notable events documented.

## 2023-10-11 NOTE — Op Note (Signed)
Providing Compassionate, Quality Care - Together  Date of service: 10/11/2023  PREOP DIAGNOSIS: Lumbar disc herniation, right extraforaminal L4-5; right lateral recess stenosis with radiculopathy  POSTOP DIAGNOSIS: Same  PROCEDURE: 1.  Right far lateral minimally invasive partial facetectomy and microdiscectomy for decompression of nerve root L 4 2.  Right hemilaminectomy, lateral recess decompression L4-5 3. Use of operating microscope 4. Use of intraoperative fluoroscopy  SURGEON: Dr. Kendell Bane C. Dellia Donnelly, DO  ASSISTANT: Dr. Barnett Abu, MD  ANESTHESIA: General Endotracheal  EBL: 20 cc  SPECIMENS: None  DRAINS: None  COMPLICATIONS: None  CONDITION: Hemodynamically stable  HISTORY: Anthony Summers is a 67 y.o. male with progressively worsening right lower extremity radiculopathy in the L4 and L5 distributions.  MRI revealed severe lateral recess stenosis at L4-5 with severe compression of the L5 nerve root with chronic disc protrusion as well as a extraforaminal herniated nucleus pulposus along the L4 nerve root.  He failed multiple conservative measures including epidural injections and therefore offered him a right far lateral microdiscectomy, right L4-5 lateral recess decompression, possible discectomy.  We discussed all risks, benefits and expected outcomes as well as alternatives to treatment.  Informed consent was obtained and witnessed.  PROCEDURE IN DETAIL: After informed consent was obtained and witnessed, the patient was brought to the operating room. After induction of general anesthesia, the patient was positioned on the operative table in the prone position with all pressure points meticulously padded. The skin of the low back was then prepped and draped in the usual sterile fashion. Physician driven timeout was performed.  Under fluoroscopy, the L 4-5 level was identified and marked out on the skin, and after timeout was conducted. Skin incision was then made  sharply with a 10 blade and Bovie electrocautery was used to dissect the subcutaneous tissue until the lumbodorsal fascia was identified. The fascia was then incised using Bovie electrocautery and the lamina at the L4 levels was identified and dissection was carried out in the subperiosteal plane using the Metrx dilators.  An appropriate sized Metrx tube was placed, and intraoperative x-ray was taken to confirm appropriate placement.  The microscope was sterilely draped and brought into the field and used for the remainder of the case.  Using Bovie electrocautery, soft tissue was cleared from the lamina and medial facet.  Using a high-speed drill, laminotomy was completed with a partial medial facetectomy. The ligamentum flavum was then identified and removed and the lateral edge of the thecal sac was identified. This was then traced down to identify the traversing nerve root.  The superior articulating process was resected from compression of the L5 nerve root.  I followed the L5 nerve root along the medial pedicle and out the foramen with a ball-tipped probe and it appeared adequately decompressed.  I retracted this gently medially and palpated there was no discernible herniated nucleus pulposus and therefore I did not perform a discectomy.  Epidural hemostasis was achieved with Surgifoam.  I placed a long-acting steroid and fentanyl over the thecal sac.  Metrix tube was removed, soft tissue and stasis was achieved with bipolar forceps.  Using the lateral fluoroscopy, a paramedian incision was made over the right L4-5 facet.  I used tubular dilators to dock over the lateral facet at L4-5.  This was confirmed with AP and lateral fluoroscopy.  Using Bovie electrocautery, I cleared the lateral facet from soft tissue.  Identified the superior articulating process, and performed a partial facetectomy down to the ligamentum flavum and intertransverse  membrane utilizing high-speed drill.  Using Kerrison rongeurs,  open the ligamentum flavum and intertransverse membrane.  Identified the exiting nerve root and gently retracted it superiorly.  The disc was identified, annulotomy was performed with 11 blade.  Using a series of micro curettes and ball-tipped probes, I extracted multiple small disc fragments that had superiorly migrated under the L4 nerve root.  At this point L4 nerve root then appeared relaxed and pulsatile.  I again followed it proximally and laterally with a ball-tipped probe and could not identify any further significant herniated nucleus pulposus.  Epidural hemostasis was achieved with Surgifoam.  Hemostasis was then secured using a combination of morcellized Gelfoam and thrombin and bipolar electrocautery. The wound is irrigated with copious amounts of antibiotic saline irrigation. The wound was noted to be excellently hemostatic.  The nerve root was then covered with a long-acting steroid solution.  The Metrx tube was then removed, hemostasis was achieved with bipolar cautery and the wound is closed in layers using 2-0 and 3-0 Vicryl stitches. The skin was closed using standard skin glue.  Sterile dressing was applied.  Drapes were taken down.  At the end of the case all sponge, needle, and instrument counts were correct. The patient was then fully transferred to the stretcher, extubated and taken to the postanesthesia care unit in stable hemodynamic condition.
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
## 2023-10-11 NOTE — Plan of Care (Signed)
CH[facility] Tonsillectomy/Adenoidectomy, Postoperative PEDS care plan entered in error.

## 2023-10-12 NOTE — Anesthesia Postprocedure Evaluation (Signed)
Anesthesia Post Note  Patient: NADINE FUTTERMAN  Procedure(s) Performed: MIS FAR LATERAL AND MIDLINE LAM/MICRODISCECTOMY, LUMBAR FOUR-LUMBAR FIVE (Right: Spine Lumbar)     Patient location during evaluation: PACU Anesthesia Type: General Level of consciousness: awake and alert Pain management: pain level controlled Vital Signs Assessment: post-procedure vital signs reviewed and stable Respiratory status: spontaneous breathing, nonlabored ventilation, respiratory function stable and patient connected to nasal cannula oxygen Cardiovascular status: blood pressure returned to baseline and stable Postop Assessment: no apparent nausea or vomiting Anesthetic complications: no   No notable events documented.  Last Vitals:  Vitals:   10/11/23 1945 10/11/23 2000  BP: (!) 141/81 (!) 147/86  Pulse: 96   Resp: 13   Temp:  (!) 36.4 C  SpO2: 95%     Last Pain:  Vitals:   10/11/23 1915  TempSrc:   PainSc: 7                  Kennieth Rad

## 2023-10-13 ENCOUNTER — Encounter (HOSPITAL_COMMUNITY): Payer: Self-pay | Admitting: Neurological Surgery

## 2023-10-14 ENCOUNTER — Other Ambulatory Visit: Payer: Self-pay

## 2023-10-16 ENCOUNTER — Encounter: Payer: Self-pay | Admitting: Surgery

## 2023-10-17 ENCOUNTER — Other Ambulatory Visit (HOSPITAL_COMMUNITY): Payer: Self-pay | Admitting: Surgery

## 2023-10-17 ENCOUNTER — Ambulatory Visit (HOSPITAL_COMMUNITY)
Admission: RE | Admit: 2023-10-17 | Discharge: 2023-10-17 | Disposition: A | Discharge status: Home / Self Care | Payer: Medicare Other | Source: Ambulatory Visit | Attending: Surgery | Admitting: Surgery

## 2023-10-17 DIAGNOSIS — R600 Localized edema: Secondary | ICD-10-CM | POA: Diagnosis present

## 2023-11-13 ENCOUNTER — Encounter (HOSPITAL_BASED_OUTPATIENT_CLINIC_OR_DEPARTMENT_OTHER): Payer: Self-pay | Admitting: *Deleted

## 2023-11-13 ENCOUNTER — Emergency Department (HOSPITAL_BASED_OUTPATIENT_CLINIC_OR_DEPARTMENT_OTHER): Payer: Medicare Other

## 2023-11-13 ENCOUNTER — Emergency Department (HOSPITAL_BASED_OUTPATIENT_CLINIC_OR_DEPARTMENT_OTHER)
Admission: EM | Admit: 2023-11-13 | Discharge: 2023-11-14 | Disposition: A | Discharge status: Home / Self Care | Payer: Medicare Other | Attending: Emergency Medicine | Admitting: Emergency Medicine

## 2023-11-13 ENCOUNTER — Other Ambulatory Visit: Payer: Self-pay

## 2023-11-13 ENCOUNTER — Emergency Department (HOSPITAL_COMMUNITY): Payer: Medicare Other

## 2023-11-13 DIAGNOSIS — Z7982 Long term (current) use of aspirin: Secondary | ICD-10-CM | POA: Diagnosis not present

## 2023-11-13 DIAGNOSIS — M545 Low back pain, unspecified: Secondary | ICD-10-CM | POA: Diagnosis present

## 2023-11-13 DIAGNOSIS — M5441 Lumbago with sciatica, right side: Secondary | ICD-10-CM | POA: Insufficient documentation

## 2023-11-13 LAB — CBC
HCT: 40 % (ref 39.0–52.0)
Hemoglobin: 13.7 g/dL (ref 13.0–17.0)
MCH: 34.2 pg — ABNORMAL HIGH (ref 26.0–34.0)
MCHC: 34.3 g/dL (ref 30.0–36.0)
MCV: 99.8 fL (ref 80.0–100.0)
Platelets: 145 10*3/uL — ABNORMAL LOW (ref 150–400)
RBC: 4.01 MIL/uL — ABNORMAL LOW (ref 4.22–5.81)
RDW: 14.3 % (ref 11.5–15.5)
WBC: 6.1 10*3/uL (ref 4.0–10.5)
nRBC: 0 % (ref 0.0–0.2)

## 2023-11-13 LAB — BASIC METABOLIC PANEL
Anion gap: 10 (ref 5–15)
BUN: 17 mg/dL (ref 8–23)
CO2: 29 mmol/L (ref 22–32)
Calcium: 8.7 mg/dL — ABNORMAL LOW (ref 8.9–10.3)
Chloride: 101 mmol/L (ref 98–111)
Creatinine, Ser: 0.63 mg/dL (ref 0.61–1.24)
GFR, Estimated: >60 mL/min (ref >60)
Glucose, Bld: 100 mg/dL — ABNORMAL HIGH (ref 70–99)
Potassium: 3.7 mmol/L (ref 3.5–5.1)
Sodium: 140 mmol/L (ref 135–145)

## 2023-11-13 MED ORDER — DIPHENHYDRAMINE HCL 50 MG/ML IJ SOLN
25.0000 mg | Freq: Once | INTRAMUSCULAR | Status: AC
  Administered 2023-11-13: 25 mg via INTRAVENOUS
  Filled 2023-11-13: qty 1

## 2023-11-13 MED ORDER — LORAZEPAM 2 MG/ML IJ SOLN
1.0000 mg | Freq: Once | INTRAMUSCULAR | Status: AC
  Administered 2023-11-13: 1 mg via INTRAVENOUS
  Filled 2023-11-13: qty 1

## 2023-11-13 MED ORDER — METHOCARBAMOL 1000 MG/10ML IJ SOLN
500.0000 mg | Freq: Once | INTRAMUSCULAR | Status: AC
  Administered 2023-11-13: 500 mg via INTRAVENOUS
  Filled 2023-11-13: qty 10

## 2023-11-13 MED ORDER — HYDROMORPHONE HCL 1 MG/ML IJ SOLN
1.0000 mg | Freq: Once | INTRAMUSCULAR | Status: AC
  Administered 2023-11-13: 1 mg via INTRAVENOUS
  Filled 2023-11-13: qty 1

## 2023-11-13 MED ORDER — METHYLPREDNISOLONE SODIUM SUCC 125 MG IJ SOLR
125.0000 mg | Freq: Once | INTRAMUSCULAR | Status: AC
  Administered 2023-11-13: 125 mg via INTRAVENOUS
  Filled 2023-11-13: qty 2

## 2023-11-13 MED ORDER — MORPHINE SULFATE (PF) 2 MG/ML IV SOLN
2.0000 mg | Freq: Once | INTRAVENOUS | Status: AC
  Administered 2023-11-13: 2 mg via INTRAVENOUS
  Filled 2023-11-13: qty 1

## 2023-11-13 MED ORDER — GADOBUTROL 1 MMOL/ML IV SOLN
10.0000 mL | Freq: Once | INTRAVENOUS | Status: AC | PRN
  Administered 2023-11-13: 10 mL via INTRAVENOUS

## 2023-11-13 MED ORDER — KETOROLAC TROMETHAMINE 15 MG/ML IJ SOLN
15.0000 mg | Freq: Once | INTRAMUSCULAR | Status: AC
  Administered 2023-11-13: 15 mg via INTRAVENOUS
  Filled 2023-11-13: qty 1

## 2023-11-13 MED ORDER — OXYCODONE-ACETAMINOPHEN 5-325 MG PO TABS: 1.0000 | ORAL_TABLET | Freq: Four times a day (QID) | ORAL | 0 refills | Status: AC | PRN

## 2023-11-13 MED ORDER — METHYLPREDNISOLONE 4 MG PO TBPK: ORAL_TABLET | ORAL | 0 refills | Status: DC

## 2023-11-13 NOTE — ED Notes (Signed)
[facility] at bedside to transport pt to [place] 

## 2023-11-13 NOTE — ED Notes (Signed)
Pt voided in urinal

## 2023-11-13 NOTE — ED Notes (Signed)
Called pts wife to p/u pts cushion left in the room

## 2023-11-13 NOTE — ED Triage Notes (Signed)
Pt had back surgery 4 weeks ago and he has been having increasing right hip pain with pain radiating down his right leg.  Nothing relieves this pain.

## 2023-11-13 NOTE — ED Notes (Signed)
Per MD pt to be transferred ED to ED via [facility] for MRI

## 2023-11-13 NOTE — ED Notes (Signed)
Pt ambulated to the bathroom.  

## 2023-11-13 NOTE — ED Notes (Signed)
Anthony Summers with [facility] called for transport

## 2023-11-13 NOTE — ED Notes (Signed)
Pt called out reporting hives on the back of his legs. Per pt this happens intermittently and he is unsure of the cause. No SOB, airway intact, hives noted on legs only. Pt requested benadryl, MD aware.

## 2023-11-13 NOTE — Discharge Instructions (Addendum)
You have been seen and discharged from the emergency department.  CT/MRI is showing findings at the surgical site that are most likely postsurgical.  Is recommended that we treat your pain and put you on a steroid taper.  Admission was offered but you have chosen to go home.  Please call the neurosurgery office tomorrow to reestablish a follow-up appointment.  Take medications as prescribed.  In regards to the pain medicine, do not mix this medication with alcohol or other sedating medications. Do not drive or do heavy physical activity until you know how this medication affects you.  It may cause drowsiness.  Follow-up with your primary provider for further evaluation and further care. Take home medications as prescribed. If you have any worsening symptoms or further concerns for your health please return to an emergency department for further evaluation.

## 2023-11-13 NOTE — ED Provider Notes (Signed)
Patient signed out to me by previous provider. Please refer to their note for full HPI.  Briefly this is a 67 year old male who is status post partial facetectomy and microdiscectomy and decompression of L4 nerve root with right hemilaminectomy and lateral recess decompression at L4-L5, with Dr. Jake Samples on 10/11/2023.  He presented to her outside facility with pain and radicular symptoms down the leg/calf.  CT done at outside facility shows abnormal density in the intervertebral foramen on the right, neurosurgery was consulted and the patient was referred here for MRI of the lumbar spine for further evaluation.  MRI imaging has resulted.  Spoke with on-call PA for Dr. Adelene Idler and she reviewed the imaging.  At this time believe it is most likely postsurgical changes.  On reevaluation patient's symptoms are improved and he is hoping to be discharged home.  No signs of acute infection at this time.  Will place the patient on pain control and a steroid Dosepak at neurosurgery request and he will call the office tomorrow for outpatient follow-up.  Admission offered in regards to pain control and patient declined.  Patient at this time appears safe and stable for discharge and close outpatient follow up. Discharge plan and strict return to ED precautions discussed, patient verbalizes understanding and agreement.     Rozelle Logan, DO 11/14/23 0006

## 2023-11-13 NOTE — ED Provider Notes (Addendum)
[facility] EMERGENCY DEPARTMENT AT [facility] Provider Note   CSN: [id number] Arrival date & time: 11/13/23  1048     History  Chief Complaint  Patient presents with   Hip Pain    Anthony Summers is a 67 y.o. male.  HPI  67 year old male comes waning of radicular pain to right hip and down to right calf.  He reports that he has been having pain and had recent surgery due to this.  Surgery was approximately 3 weeks ago.  He had some decrease in the pain over the a week or so afterwards but it has been back to the previous surgical pain for a week.  He has not noted any fever, chills, loss of bowel or bladder control, loss of pain or strength.  He reports that he has some swelling in his foot and had a postoperative ultrasound performed that was negative.  He states that he has called the office previously but had not called them again until today when he was unable to tolerate the pain and came to the ED.   Reviewed operative note from Dr. Jake Samples with surgery 10/11/2023.  Patient had partial facetectomy and microdiscectomy for decompression of nerve root L4 with right hemilaminectomy lateral recess decompression L4-L5 Home Medications Prior to Admission medications   Medication Sig Start Date End Date Taking? Authorizing Provider  albuterol (VENTOLIN HFA) 108 (90 Base) MCG/ACT inhaler Inhale 1-2 puffs into the lungs every 6 (six) hours as needed for shortness of breath or wheezing. 05/13/20   [provider]  ALPRAZolam Prudy Feeler) 0.5 MG tablet Take 0.5 mg by mouth as needed for anxiety or sleep.    [provider]  aspirin EC 81 MG tablet Take 1 tablet (81 mg total) by mouth daily. Swallow whole. 10/18/23   Dawley, Troy C, DO  Cholecalciferol (VITAMIN D3 ULTRA STRENGTH) 125 MCG (5000 UT) capsule Take 5,000 Units by mouth daily.    [provider]  ezetimibe (ZETIA) 10 MG tablet Take 10 mg by mouth in the morning. 08/18/19   [provider]   fluticasone (FLONASE) 50 MCG/ACT nasal spray Place 1-2 sprays into both nostrils daily as needed for allergies. 08/09/19   [provider]  furosemide (LASIX) 20 MG tablet Take 20 mg by mouth in the morning. 08/18/19   [provider]  HYDROcodone-acetaminophen (NORCO/VICODIN) 5-325 MG tablet Take 1-2 tablets by mouth every 6 (six) hours as needed for moderate pain (pain score 4-6). 10/11/23 10/10/24  Dawley, Alan Mulder, DO  Multiple Vitamins-Minerals (CENTRUM SILVER 50+MEN) TABS Take 1 tablet by mouth in the morning.    [provider]  Multiple Vitamins-Minerals (PRESERVISION AREDS 2 PO) Take 2 capsules by mouth in the morning.    [provider]  rosuvastatin (CRESTOR) 10 MG tablet Take 1 tablet by mouth daily. 05/07/23   Nada Libman, MD  Sodium Hyaluronate, oral, (HYALURONIC ACID PO) Take 250 mg by mouth in the morning.    [provider]  tiZANidine (ZANAFLEX) 2 MG tablet Take 1 tablet (2 mg total) by mouth every 8 (eight) hours as needed for muscle spasms. 10/11/23   Dawley, Alan Mulder, DO  White Petrolatum-Mineral Oil (REFRESH LACRI-LUBE OP) Place 1 Application into both eyes 4 (four) times daily.    [provider]      Allergies    Patient has no known allergies.    Review of Systems   Review of Systems  Physical Exam Updated Vital Signs BP Marland Kitchen)  160/90   Pulse (!) 103   Temp 98 F (36.7 C) (Oral)   Resp 16   SpO2 98%  Physical Exam Vitals and nursing note reviewed.  Constitutional:      Appearance: He is well-developed.  HENT:     Head: Normocephalic and atraumatic.     Right Ear: External ear normal.     Left Ear: External ear normal.     Nose: Nose normal.  Eyes:     Extraocular Movements: Extraocular movements intact.  Neck:     Trachea: No tracheal deviation.  Cardiovascular:     Rate and Rhythm: Normal rate.     Pulses: Normal pulses.  Pulmonary:     Effort: Pulmonary effort is normal.  Abdominal:     General:  Abdomen is flat.     Palpations: Abdomen is soft.  Musculoskeletal:        General: Normal range of motion.     Cervical back: Normal range of motion.     Comments: Back examined with no obvious signs of external redness, swelling   Skin:    General: Skin is warm and dry.     Capillary Refill: Capillary refill takes less than 2 seconds.  Neurological:     General: No focal deficit present.     Mental Status: He is alert and oriented to person, place, and time.     Cranial Nerves: No cranial nerve deficit.     Sensory: No sensory deficit.     Motor: No weakness.     Coordination: Coordination normal.  Psychiatric:        Mood and Affect: Mood normal.        Behavior: Behavior normal.     ED Results / Procedures / Treatments   Labs (all labs ordered are listed, but only abnormal results are displayed) Labs Reviewed  CBC - Abnormal; Notable for the following components:      Result Value   RBC 4.01 (*)    MCH 34.2 (*)    Platelets 145 (*)    All other components within normal limits  BASIC METABOLIC PANEL - Abnormal; Notable for the following components:   Glucose, Bld 100 (*)    Calcium 8.7 (*)    All other components within normal limits    EKG None  Radiology CT Lumbar Spine Wo Contrast  Result Date: 11/13/2023 CLINICAL DATA:  Low back pain. Infection suspected. Surgery 4 weeks ago. Worsening pain in the right hip and leg. EXAM: CT LUMBAR SPINE WITHOUT CONTRAST TECHNIQUE: Multidetector CT imaging of the lumbar spine was performed without intravenous contrast administration. Multiplanar CT image reconstructions were also generated. RADIATION DOSE REDUCTION: This exam was performed according to the departmental dose-optimization program which includes automated exposure control, adjustment of the mA and/or kV according to patient size and/or use of iterative reconstruction technique. COMPARISON:  Radiography 10/11/2023.  MRI 03/06/2023. FINDINGS: Segmentation: 5 lumbar  type vertebral bodies. Alignment: Minimal scoliotic curvature convex to the right. Straightening of the normal cervical lordosis. Vertebrae: No fracture or focal bone lesion. No other lytic destructive process. Paraspinal and other soft tissues: Previous aortic stent graft placement. Soft tissue changes related to the surgical approach on the right at L4-5. No evidence of fluid collection in the region. Disc levels: No significant finding at L3-4 or above. Mild disc bulges. No stenosis of the canal or foramina. Facet osteoarthritis most notable on the right at L2-3 which could relate to regional pain. L4-5: Previous right laminotomy. Abnormal  density within the intervertebral foramen on the right of indeterminate nature by CT. This could represent postoperative edema or scarring, but the possibility of right foraminal disc material is not excluded. The right L4 nerve could be affected. MRI could differentiate. L5-S1: Chronic disc degeneration with loss of disc height and vacuum phenomenon. Endplate osteophytes and mild bulging of the disc. No compressive narrowing of the canal or foramina. Sacroiliac joints appear normal. IMPRESSION: 1. Previous right laminotomy at L4-5. Abnormal density within the intervertebral foramen on the right of indeterminate nature by CT. This could represent postoperative edema or scarring, but the possibility of right foraminal disc material is not excluded. The right L4 nerve could be affected. MRI could differentiate. 2. Chronic disc degeneration at L5-S1 with loss of disc height and vacuum phenomenon. Endplate osteophytes and mild bulging of the disc. No compressive narrowing of the canal or foramina. 3. Facet osteoarthritis most notable on the right at L2-3 which could relate to regional pain. Electronically Signed   By: Paulina Fusi M.D.   On: 11/13/2023 14:47    Procedures Procedures    Medications Ordered in ED Medications  methylPREDNISolone sodium succinate (SOLU-MEDROL)  125 mg/2 mL injection 125 mg (125 mg Intravenous Given 11/13/23 1210)  ketorolac (TORADOL) 15 MG/ML injection 15 mg (15 mg Intravenous Given 11/13/23 1208)  methocarbamol (ROBAXIN) injection 500 mg (500 mg Intravenous Given 11/13/23 1214)  morphine (PF) 2 MG/ML injection 2 mg (2 mg Intravenous Given 11/13/23 1442)    ED Course/ Medical Decision Making/ A&P Clinical Course as of 11/13/23 1521  Wed Nov 13, 2023  1259 CBC reviewed interpreted and for mild hypocalcemia 8.7 otherwise within normal limits CBC is reviewed and interpreted as within normal limits [DR]    Clinical Course User Index [DR] Margarita Grizzle, MD                                 Medical Decision Making Amount and/or Complexity of Data Reviewed Labs: ordered. Radiology: ordered.  Risk Prescription drug management.   67 year old male recent back surgery presents today with worsening back pain with radiculopathy down right leg. CT was obtained here that has abnormal density the intervertebral foramen on the right. Discussed with neurosurgical, Stacie Acres, she advises transfer to [facility] for MRI lumbar spine with and without contrast and they will evaluate if abnormal.  Patient has received Solu-Medrol, Robaxin, Toradol, and morphine for pain control.   Messaged Dr. Andria Meuse who accepts [facility]    Final Clinical Impression(s) / ED Diagnoses Final diagnoses:  Right-sided low back pain with right-sided sciatica, unspecified chronicity    Rx / DC Orders ED Discharge Orders     None         Margarita Grizzle, MD 11/13/23 1521    Margarita Grizzle, MD 11/13/23 1551

## 2023-11-23 ENCOUNTER — Telehealth (HOSPITAL_BASED_OUTPATIENT_CLINIC_OR_DEPARTMENT_OTHER): Payer: Self-pay | Admitting: Emergency Medicine

## 2023-11-27 ENCOUNTER — Telehealth: Payer: Self-pay

## 2023-11-27 DIAGNOSIS — E785 Hyperlipidemia, unspecified: Secondary | ICD-10-CM

## 2023-11-27 DIAGNOSIS — I2510 Atherosclerotic heart disease of native coronary artery without angina pectoris: Secondary | ICD-10-CM

## 2023-11-27 MED ORDER — ROSUVASTATIN CALCIUM 20 MG PO TABS: 20.0000 mg | ORAL_TABLET | Freq: Every day | ORAL | 3 refills | Status: DC

## 2023-11-27 NOTE — Telephone Encounter (Signed)
-----   Message from Christell Constant sent at 09/27/2023  2:36 PM EDT ----- LDL above goal.  Offer doubling of rosuvastatin dose and labs in three months. ----- Message ----- From: Rayburn Felt Sent: 09/26/2023   4:50 PM EDT To: Christell Constant, MD

## 2023-11-27 NOTE — Telephone Encounter (Signed)
The patient has been notified of the result and verbalized understanding.  All questions (if any) were answered. Macil Crady N Trisha Morandi, RN 11/27/2023 3:01 PM   Scheduled OV for 01/31/24 at 1:40 pm.  Orders for FLP and ALT placed and released for draw in March.

## 2023-12-11 ENCOUNTER — Encounter: Payer: Self-pay | Admitting: Physical Therapy

## 2023-12-11 ENCOUNTER — Other Ambulatory Visit: Payer: Self-pay

## 2023-12-11 ENCOUNTER — Ambulatory Visit (INDEPENDENT_AMBULATORY_CARE_PROVIDER_SITE_OTHER): Payer: Medicare Other | Admitting: Physical Therapy

## 2023-12-11 DIAGNOSIS — R262 Difficulty in walking, not elsewhere classified: Secondary | ICD-10-CM | POA: Diagnosis not present

## 2023-12-11 DIAGNOSIS — G8929 Other chronic pain: Secondary | ICD-10-CM

## 2023-12-11 DIAGNOSIS — M5459 Other low back pain: Secondary | ICD-10-CM

## 2023-12-11 DIAGNOSIS — M6281 Muscle weakness (generalized): Secondary | ICD-10-CM

## 2023-12-11 DIAGNOSIS — M5441 Lumbago with sciatica, right side: Secondary | ICD-10-CM

## 2023-12-11 DIAGNOSIS — R2681 Unsteadiness on feet: Secondary | ICD-10-CM
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
## 2024-01-31 ENCOUNTER — Encounter: Payer: Self-pay | Admitting: Internal Medicine

## 2024-01-31 ENCOUNTER — Ambulatory Visit: Payer: Medicare Other | Attending: Internal Medicine | Admitting: Internal Medicine

## 2024-01-31 VITALS — BP 130/60 | HR 95 | Ht 73.0 in | Wt 193.4 lb

## 2024-01-31 DIAGNOSIS — I2510 Atherosclerotic heart disease of native coronary artery without angina pectoris: Secondary | ICD-10-CM | POA: Insufficient documentation

## 2024-01-31 DIAGNOSIS — E785 Hyperlipidemia, unspecified: Secondary | ICD-10-CM | POA: Diagnosis not present

## 2024-01-31 DIAGNOSIS — I700 Atherosclerosis of aorta: Secondary | ICD-10-CM | POA: Diagnosis not present

## 2024-01-31 MED ORDER — FUROSEMIDE 20 MG PO TABS: 20.0000 mg | ORAL_TABLET | Freq: Every day | ORAL | 11 refills | Status: AC | PRN

## 2024-01-31 MED ORDER — ROSUVASTATIN CALCIUM 20 MG PO TABS: 20.0000 mg | ORAL_TABLET | Freq: Every day | ORAL | 3 refills | Status: DC

## 2024-01-31 NOTE — Progress Notes (Signed)
 Cardiology Office Note:  .    Date:  01/31/2024  ID:  Anthony Summers, DOB [birth year], MRN [id number] PCP: Gerome Brunet, DO  [facility] Providers Cardiologist:  Stanly DELENA Leavens, MD     CC: Follow up CAC and secondary prevention    History of Present Illness: .    Anthony Summers is a 68 y.o. male  with COPD, bilateral carotid artery stenosis, and a history of AAA repair who presents for follow-up.  He has coronary artery disease with coronary artery calcifications. No history of chest pain. He is on statin therapy, and his cholesterol levels are being monitored.  He has experienced a decrease in the frequency of dizzy spells since discontinuing losartan . His blood pressure has been stable, except for an episode of high blood pressure during a hospital visit, which he attributes to extreme pain at the time.  He has a history of COPD and continues to experience wheezing. He smokes but is not currently interested in smoking cessation.  He reports swelling in his lower extremities, particularly in one leg. He occasionally uses compression stockings to manage the swelling. He is taking Lasix  20 mg daily and has an additional dose to use as needed for increased swelling.  He has a history of hyperlipidemia and aortic atherosclerosis. His rosuvastatin  dosage was increased approximately three months ago.  He underwent an AAA repair in the past and is followed by vascular surgery for bilateral carotid artery stenosis.  Relevant histories: .  Social  - comes with wife - pre [facility] for smoking cessation  2023: found to have prostate cancer.  Did will with AAA repair. 2024: Had no perfusion defect on PET with normal stress flows. ROS: As per HPI.   Studies Reviewed: .   Cardiac Studies & Procedures     STRESS TESTS  NM PET CT CARDIAC PERFUSION MULTI W/ABSOLUTE BLOODFLOW 11/13/2022  Narrative   LV perfusion is normal. There is no evidence of ischemia. There  is no evidence of infarction. No TID.  There is global decrease in MBFR 1.59 with high resting flows.   Rest left ventricular function is normal. Rest EF: 79 %. Stress left ventricular function is normal. Stress EF: 67 %. End diastolic cavity size is normal.   Myocardial blood flow was computed to be 1.74ml/g/min at rest and 3.06ml/g/min at stress. Global myocardial blood flow reserve was 1.61 and was abnormal.   Coronary calcium  was present on the attenuation correction CT images. Coronary calcifications were present in the left anterior descending artery, left circumflex artery and right coronary artery distribution(s).   The study is normal. The study is intermediate risk.   Signed by Stanly Leavens MD  CLINICAL DATA:  This over-read does not include interpretation of cardiac or coronary anatomy or pathology. The Cardiac PET CT interpretation by the cardiologist is attached.  COMPARISON:  None Available.  FINDINGS: Vascular: Aortic atherosclerosis.  Mediastinum/Nodes: Within the visualized portions of the chest there are no pathologically enlarged mediastinal or hilar lymph nodes within the limitation of noncontrast enhanced examination. The distal esophagus is grossly unremarkable.  Lungs/Pleura: Within the visualized portions of the chest there are no suspicious pulmonary nodules or masses, no focal airspace consolidation and no pleural effusion or pneumothorax.  Upper Abdomen: Diffuse hepatic steatosis.  Musculoskeletal: No acute osseous abnormality.  IMPRESSION: 1. No acute noncardiac findings in the chest. 2. Diffuse hepatic steatosis. 3.  Aortic Atherosclerosis (ICD10-I70.0).   Electronically Signed By: Reyes Holder M.D. On: 11/13/2022  14:40  ECHOCARDIOGRAM  ECHOCARDIOGRAM COMPLETE 08/23/2023  Narrative ECHOCARDIOGRAM REPORT    Patient Name:   Anthony Summers Date of Exam: 08/23/2023 Medical Rec #:  [id number]         Height:       73.0 in Accession #:     [id number]        Weight:       191.8 lb Date of Birth:  1956-08-15         BSA:          2.114 m Patient Age:    67 years          BP:           124/77 mmHg Patient Gender: M                 HR:           110 bpm. Exam Location:  [address]  Procedure: 2D Echo, Cardiac Doppler and Color Doppler  Indications:    I77.810 Aortic Dilation  History:        Patient has prior history of Echocardiogram examinations, most recent 01/16/2022. COPD, Arrythmias:PVC; Risk Factors:Hypertension and Dyslipidemia.  Sonographer:    Carl Rodgers-Jones RDCS Referring Phys: [id number] DAYNA N DUNN  IMPRESSIONS   1. Left ventricular ejection fraction, by estimation, is >75%. The left ventricle has hyperdynamic function. The left ventricle has no regional wall motion abnormalities. Left ventricular diastolic parameters were normal. 2. Right ventricular systolic function is normal. The right ventricular size is normal. 3. The mitral valve is normal in structure. No evidence of mitral valve regurgitation. No evidence of mitral stenosis. 4. The aortic valve has an indeterminant number of cusps. Aortic valve regurgitation is not visualized. No aortic stenosis is present. 5. Aortic dilatation noted. There is mild dilatation of the aortic root, measuring 40 mm. 6. The inferior vena cava is normal in size with greater than 50% respiratory variability, suggesting right atrial pressure of 3 mmHg.  FINDINGS Left Ventricle: Left ventricular ejection fraction, by estimation, is >75%. The left ventricle has hyperdynamic function. The left ventricle has no regional wall motion abnormalities. The left ventricular internal cavity size was normal in size. There is no left ventricular hypertrophy. Left ventricular diastolic parameters were normal.  Right Ventricle: The right ventricular size is normal. Right ventricular systolic function is normal.  Left Atrium: Left atrial size was normal in size.  Right Atrium: Right  atrial size was normal in size.  Pericardium: There is no evidence of pericardial effusion.  Mitral Valve: The mitral valve is normal in structure. Mild mitral annular calcification. No evidence of mitral valve regurgitation. No evidence of mitral valve stenosis.  Tricuspid Valve: The tricuspid valve is normal in structure. Tricuspid valve regurgitation is not demonstrated. No evidence of tricuspid stenosis.  Aortic Valve: The aortic valve has an indeterminant number of cusps. Aortic valve regurgitation is not visualized. No aortic stenosis is present.  Pulmonic Valve: The pulmonic valve was not well visualized. Pulmonic valve regurgitation is not visualized. No evidence of pulmonic stenosis.  Aorta: Aortic dilatation noted. There is mild dilatation of the aortic root, measuring 40 mm.  Venous: The inferior vena cava is normal in size with greater than 50% respiratory variability, suggesting right atrial pressure of 3 mmHg.  IAS/Shunts: No atrial level shunt detected by color flow Doppler.   LEFT VENTRICLE PLAX 2D LVIDd:         4.80 cm   Diastology LVIDs:  3.50 cm   LV e' medial:    8.70 cm/s LV PW:         0.70 cm   LV E/e' medial:  7.9 LV IVS:        1.00 cm   LV e' lateral:   7.88 cm/s LVOT diam:     2.30 cm   LV E/e' lateral: 8.7 LV SV:         64 LV SV Index:   30 LVOT Area:     4.15 cm   RIGHT VENTRICLE             IVC RV Basal diam:  3.10 cm     IVC diam: 1.20 cm RV S prime:     16.60 cm/s  LEFT ATRIUM             Index        RIGHT ATRIUM          Index LA diam:        4.30 cm 2.03 cm/m   RA Area:     9.39 cm LA Vol (A2C):   45.4 ml 21.48 ml/m  RA Volume:   18.10 ml 8.56 ml/m LA Vol (A4C):   40.4 ml 19.11 ml/m LA Biplane Vol: 43.6 ml 20.63 ml/m AORTIC VALVE LVOT Vmax:   114.33 cm/s LVOT Vmean:  75.800 cm/s LVOT VTI:    0.153 m  AORTA Ao Root diam: 4.00 cm Ao Asc diam:  3.80 cm  MITRAL VALVE MV Area (PHT): 4.44 cm     SHUNTS MV Decel Time: 171  msec     Systemic VTI:  0.15 m MV E velocity: 68.90 cm/s   Systemic Diam: 2.30 cm MV A velocity: 133.00 cm/s MV E/A ratio:  0.52  Redell Shallow MD Electronically signed by Redell Shallow MD Signature Date/Time: 08/23/2023/2:00:04 PM    Final             Physical Exam:    VS:  BP 130/60 (BP Location: Right Arm)   Pulse 95   Ht 6' 1 (1.854 m)   Wt 193 lb 6.4 oz (87.7 kg)   SpO2 96%   BMI 25.52 kg/m    Wt Readings from Last 3 Encounters:  01/31/24 193 lb 6.4 oz (87.7 kg)  10/11/23 178 lb (80.7 kg)  10/03/23 188 lb 6.4 oz (85.5 kg)    Gen: no distress   Neck: No JVD, Bilateral carotid bruit Ears:  Dempsey Sign Cardiac: No Rubs or Gallops, no murmur, RRR +2 radial pulses Respiratory: Clear to auscultation bilaterally, normal effort, normal  respiratory rate GI: Soft, nontender, non-distended  MS: +1 R leg  edema;  moves all extremities Integument: Skin feels warm Neuro:  At time of evaluation, alert and oriented to person/place/time/situation  Psych: Normal affect, patient feels ok   ASSESSMENT AND PLAN: .    Coronary Artery Calcification No current chest pain. Patient informed to report any chest pain for more aggressive management.  Bilateral Carotid Artery Stenosis - Continue follow-up with vascular surgery  Abdominal Aortic Aneurysm (AAA) Status Post Repair - Continue follow-up with vascular surgery  Hyperlipidemia Managed with rosuvastatin . Dosage increased three months ago. Needs fasting lipid panel to assess current dosage effectiveness. Patient prefers fasting lipid check to avoid unnecessary medication adjustments. - Order fasting lipid panel and ALT at [facility]  Lower Extremity Edema Increased swelling in one leg. Physical exam reveals isolated swelling without signs of DVT. Currently on Lasix  20 mg PO daily. Additional  Lasix  prescribed for use as needed during high-sodium intake events. - Prescribe Lasix  20 mg PO as needed for additional  swelling  Hypertension, Dizziness, and Smoking Not interested in smoking cessation. Blood pressure well-controlled after discontinuing losartan . Patient educated on potential future medication changes. - Discuss smoking cessation options at future visits - Monitor blood pressure regularly  Follow-up - Schedule follow-up visit in one year  Stanly Leavens, MD FASE [facility] Cardiologist [facility]  [address] [place], [address] [place], [place] [address] [phone]  2:22 PM

## 2024-01-31 NOTE — Patient Instructions (Signed)
 Medication Instructions:  Your physician has recommended you make the following change in your medication:  START: furosemide  (Lasix ) 20 mg by mouth once daily as needed   *If you need a refill on your cardiac medications before your next appointment, please call your pharmacy*   Lab Work: FLP, ALT  If you have labs (blood work) drawn today and your tests are completely normal, you will receive your results only by: MyChart Message (if you have MyChart) OR A paper copy in the mail If you have any lab test that is abnormal or we need to change your treatment, we will call you to review the results.   Testing/Procedures: NONE   Follow-Up: At [facility], you and your health needs are our priority.  As part of our continuing mission to provide you with exceptional heart care, we have created designated Provider Care Teams.  These Care Teams include your primary Cardiologist (physician) and Advanced Practice Providers (APPs -  Physician Assistants and Nurse Practitioners) who all work together to provide you with the care you need, when you need it.   Your next appointment:   1 year(s)  Provider:   Stanly Leavens, MD

## 2024-02-18 ENCOUNTER — Encounter: Payer: Self-pay | Admitting: Physical Therapy

## 2024-02-18 ENCOUNTER — Ambulatory Visit (INDEPENDENT_AMBULATORY_CARE_PROVIDER_SITE_OTHER): Payer: Medicare Other | Admitting: Physical Therapy

## 2024-02-18 DIAGNOSIS — M6281 Muscle weakness (generalized): Secondary | ICD-10-CM | POA: Diagnosis not present

## 2024-02-18 DIAGNOSIS — G8929 Other chronic pain: Secondary | ICD-10-CM | POA: Diagnosis not present

## 2024-02-18 DIAGNOSIS — R2681 Unsteadiness on feet: Secondary | ICD-10-CM

## 2024-02-18 DIAGNOSIS — R262 Difficulty in walking, not elsewhere classified: Secondary | ICD-10-CM

## 2024-02-18 DIAGNOSIS — M5441 Lumbago with sciatica, right side: Secondary | ICD-10-CM

## 2024-02-18 DIAGNOSIS — M5459 Other low back pain: Secondary | ICD-10-CM
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
## 2024-03-10 ENCOUNTER — Encounter: Payer: Self-pay | Admitting: Physical Therapy

## 2024-03-10 ENCOUNTER — Ambulatory Visit (INDEPENDENT_AMBULATORY_CARE_PROVIDER_SITE_OTHER): Admitting: Physical Therapy

## 2024-03-10 DIAGNOSIS — M6281 Muscle weakness (generalized): Secondary | ICD-10-CM

## 2024-03-10 DIAGNOSIS — R262 Difficulty in walking, not elsewhere classified: Secondary | ICD-10-CM | POA: Diagnosis not present

## 2024-03-10 DIAGNOSIS — M5459 Other low back pain: Secondary | ICD-10-CM | POA: Diagnosis not present

## 2024-03-10 DIAGNOSIS — R2681 Unsteadiness on feet: Secondary | ICD-10-CM
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
## 2024-03-16 ENCOUNTER — Encounter: Admitting: Physical Therapy

## 2024-03-18 ENCOUNTER — Ambulatory Visit (INDEPENDENT_AMBULATORY_CARE_PROVIDER_SITE_OTHER): Admitting: Physical Therapy

## 2024-03-18 ENCOUNTER — Encounter: Payer: Self-pay | Admitting: Physical Therapy

## 2024-03-18 DIAGNOSIS — R262 Difficulty in walking, not elsewhere classified: Secondary | ICD-10-CM | POA: Diagnosis not present

## 2024-03-18 DIAGNOSIS — M5459 Other low back pain: Secondary | ICD-10-CM | POA: Diagnosis not present

## 2024-03-18 DIAGNOSIS — M6281 Muscle weakness (generalized): Secondary | ICD-10-CM | POA: Diagnosis not present

## 2024-03-18 DIAGNOSIS — R2681 Unsteadiness on feet: Secondary | ICD-10-CM
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
## 2024-03-27 ENCOUNTER — Encounter: Payer: Self-pay | Admitting: Physical Therapy

## 2024-03-27 ENCOUNTER — Ambulatory Visit (INDEPENDENT_AMBULATORY_CARE_PROVIDER_SITE_OTHER): Admitting: Physical Therapy

## 2024-03-27 DIAGNOSIS — G8929 Other chronic pain: Secondary | ICD-10-CM

## 2024-03-27 DIAGNOSIS — R262 Difficulty in walking, not elsewhere classified: Secondary | ICD-10-CM | POA: Diagnosis not present

## 2024-03-27 DIAGNOSIS — M5441 Lumbago with sciatica, right side: Secondary | ICD-10-CM | POA: Diagnosis not present

## 2024-03-27 DIAGNOSIS — M5459 Other low back pain: Secondary | ICD-10-CM

## 2024-03-27 DIAGNOSIS — M6281 Muscle weakness (generalized): Secondary | ICD-10-CM

## 2024-03-27 DIAGNOSIS — R2681 Unsteadiness on feet: Secondary | ICD-10-CM
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
## 2024-04-01 ENCOUNTER — Encounter: Payer: Self-pay | Admitting: Rehabilitative and Restorative Service Providers"

## 2024-04-01 ENCOUNTER — Ambulatory Visit (INDEPENDENT_AMBULATORY_CARE_PROVIDER_SITE_OTHER): Admitting: Rehabilitative and Restorative Service Providers"

## 2024-04-01 DIAGNOSIS — M6281 Muscle weakness (generalized): Secondary | ICD-10-CM | POA: Diagnosis not present

## 2024-04-01 DIAGNOSIS — G8929 Other chronic pain: Secondary | ICD-10-CM

## 2024-04-01 DIAGNOSIS — M5459 Other low back pain: Secondary | ICD-10-CM | POA: Diagnosis not present

## 2024-04-01 DIAGNOSIS — R262 Difficulty in walking, not elsewhere classified: Secondary | ICD-10-CM

## 2024-04-01 DIAGNOSIS — R2681 Unsteadiness on feet: Secondary | ICD-10-CM
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
## 2024-04-03 ENCOUNTER — Ambulatory Visit (INDEPENDENT_AMBULATORY_CARE_PROVIDER_SITE_OTHER): Admitting: Physical Therapy

## 2024-04-03 ENCOUNTER — Encounter: Payer: Self-pay | Admitting: Physical Therapy

## 2024-04-03 DIAGNOSIS — M6281 Muscle weakness (generalized): Secondary | ICD-10-CM | POA: Diagnosis not present

## 2024-04-03 DIAGNOSIS — R262 Difficulty in walking, not elsewhere classified: Secondary | ICD-10-CM | POA: Diagnosis not present

## 2024-04-03 DIAGNOSIS — G8929 Other chronic pain: Secondary | ICD-10-CM | POA: Diagnosis not present

## 2024-04-03 DIAGNOSIS — M5459 Other low back pain: Secondary | ICD-10-CM | POA: Diagnosis not present

## 2024-04-03 DIAGNOSIS — R2681 Unsteadiness on feet: Secondary | ICD-10-CM
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
## 2024-04-06 ENCOUNTER — Ambulatory Visit (INDEPENDENT_AMBULATORY_CARE_PROVIDER_SITE_OTHER): Admitting: Rehabilitative and Restorative Service Providers"

## 2024-04-06 ENCOUNTER — Encounter: Payer: Self-pay | Admitting: Rehabilitative and Restorative Service Providers"

## 2024-04-06 DIAGNOSIS — R2681 Unsteadiness on feet: Secondary | ICD-10-CM

## 2024-04-06 DIAGNOSIS — M5459 Other low back pain: Secondary | ICD-10-CM

## 2024-04-06 DIAGNOSIS — R262 Difficulty in walking, not elsewhere classified: Secondary | ICD-10-CM

## 2024-04-06 DIAGNOSIS — M6281 Muscle weakness (generalized): Secondary | ICD-10-CM

## 2024-04-06 DIAGNOSIS — G8929 Other chronic pain: Secondary | ICD-10-CM
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
## 2024-04-08 ENCOUNTER — Ambulatory Visit: Admitting: Rehabilitative and Restorative Service Providers"

## 2024-04-08 ENCOUNTER — Encounter: Payer: Self-pay | Admitting: Rehabilitative and Restorative Service Providers"

## 2024-04-08 DIAGNOSIS — R2681 Unsteadiness on feet: Secondary | ICD-10-CM

## 2024-04-08 DIAGNOSIS — M6281 Muscle weakness (generalized): Secondary | ICD-10-CM | POA: Diagnosis not present

## 2024-04-08 DIAGNOSIS — G8929 Other chronic pain: Secondary | ICD-10-CM

## 2024-04-08 DIAGNOSIS — M5459 Other low back pain: Secondary | ICD-10-CM

## 2024-04-08 DIAGNOSIS — R262 Difficulty in walking, not elsewhere classified: Secondary | ICD-10-CM | POA: Diagnosis not present
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
## 2024-04-13 ENCOUNTER — Ambulatory Visit (INDEPENDENT_AMBULATORY_CARE_PROVIDER_SITE_OTHER): Admitting: Physical Therapy

## 2024-04-13 ENCOUNTER — Encounter: Payer: Self-pay | Admitting: Physical Therapy

## 2024-04-13 DIAGNOSIS — M5441 Lumbago with sciatica, right side: Secondary | ICD-10-CM | POA: Diagnosis not present

## 2024-04-13 DIAGNOSIS — G8929 Other chronic pain: Secondary | ICD-10-CM | POA: Diagnosis not present

## 2024-04-13 DIAGNOSIS — R262 Difficulty in walking, not elsewhere classified: Secondary | ICD-10-CM | POA: Diagnosis not present

## 2024-04-13 DIAGNOSIS — R2681 Unsteadiness on feet: Secondary | ICD-10-CM

## 2024-04-13 DIAGNOSIS — M5459 Other low back pain: Secondary | ICD-10-CM

## 2024-04-13 DIAGNOSIS — M6281 Muscle weakness (generalized): Secondary | ICD-10-CM
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
## 2024-04-15 ENCOUNTER — Encounter: Admitting: Rehabilitative and Restorative Service Providers"

## 2024-04-22 ENCOUNTER — Ambulatory Visit (INDEPENDENT_AMBULATORY_CARE_PROVIDER_SITE_OTHER): Admitting: Rehabilitative and Restorative Service Providers"

## 2024-04-22 ENCOUNTER — Encounter: Payer: Self-pay | Admitting: Rehabilitative and Restorative Service Providers"

## 2024-04-22 DIAGNOSIS — R2681 Unsteadiness on feet: Secondary | ICD-10-CM

## 2024-04-22 DIAGNOSIS — R262 Difficulty in walking, not elsewhere classified: Secondary | ICD-10-CM

## 2024-04-22 DIAGNOSIS — M6281 Muscle weakness (generalized): Secondary | ICD-10-CM | POA: Diagnosis not present

## 2024-04-22 DIAGNOSIS — M5459 Other low back pain: Secondary | ICD-10-CM

## 2024-04-22 DIAGNOSIS — M5441 Lumbago with sciatica, right side: Secondary | ICD-10-CM | POA: Diagnosis not present

## 2024-04-22 DIAGNOSIS — G8929 Other chronic pain: Secondary | ICD-10-CM
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
## 2024-07-23 ENCOUNTER — Other Ambulatory Visit: Payer: Self-pay | Admitting: *Deleted

## 2024-07-23 DIAGNOSIS — Z9889 Other specified postprocedural states: Secondary | ICD-10-CM

## 2024-08-17 ENCOUNTER — Ambulatory Visit (HOSPITAL_COMMUNITY)
Admission: RE | Admit: 2024-08-17 | Discharge: 2024-08-17 | Disposition: A | Discharge status: Home / Self Care | Source: Ambulatory Visit | Attending: Surgery | Admitting: Surgery

## 2024-08-17 ENCOUNTER — Ambulatory Visit (INDEPENDENT_AMBULATORY_CARE_PROVIDER_SITE_OTHER): Admitting: Physician Assistant

## 2024-08-17 VITALS — BP 144/81 | HR 84 | Temp 97.9°F | Wt 190.2 lb

## 2024-08-17 DIAGNOSIS — I714 Abdominal aortic aneurysm, without rupture, unspecified: Secondary | ICD-10-CM | POA: Diagnosis not present

## 2024-08-17 DIAGNOSIS — Z9889 Other specified postprocedural states: Secondary | ICD-10-CM | POA: Insufficient documentation

## 2024-08-17 NOTE — Progress Notes (Signed)
 Office Note   History of Present Illness   Anthony Summers is a 68 y.o. (Mar 02, 1956) male who presents for follow up.  He has a history of EVAR on 01/24/2022 by Dr. Serene.  We also followed the patient for known bilateral ICA stenosis 1 to 39%.  He returns today for follow-up.  He says that he is doing well.  He feels like he is finally recovered from back surgery he had in October.  He denies any new or changing abdominal or back pain.  He also denies any strokelike symptoms such as slurred speech, facial droop, sudden weakness/numbness, or sudden visual changes.  Current Outpatient Medications  Medication Sig Dispense Refill   albuterol  (VENTOLIN  HFA) 108 (90 Base) MCG/ACT inhaler Inhale 1-2 puffs into the lungs every 6 (six) hours as needed for shortness of breath or wheezing.     ALPRAZolam (XANAX) 0.5 MG tablet Take 0.5 mg by mouth as needed for anxiety or sleep.     aspirin  EC 81 MG tablet Take 1 tablet (81 mg total) by mouth daily. Swallow whole.     Cholecalciferol (VITAMIN D3 ULTRA STRENGTH) 125 MCG (5000 UT) capsule Take 5,000 Units by mouth daily.     ezetimibe  (ZETIA ) 10 MG tablet Take 10 mg by mouth in the morning.     fluticasone  (FLONASE ) 50 MCG/ACT nasal spray Place 1-2 sprays into both nostrils daily as needed for allergies.     furosemide  (LASIX ) 20 MG tablet Take 20 mg by mouth in the morning.     furosemide  (LASIX ) 20 MG tablet Take 1 tablet (20 mg total) by mouth daily as needed. 30 tablet 11   Multiple Vitamins-Minerals (CENTRUM SILVER 50+MEN) TABS Take 1 tablet by mouth in the morning.     Multiple Vitamins-Minerals (PRESERVISION AREDS 2 PO) Take 2 capsules by mouth in the morning.     oxyCODONE -acetaminophen  (PERCOCET/ROXICET) 5-325 MG tablet Take 1 tablet by mouth every 6 (six) hours as needed for severe pain (pain score 7-10). 15 tablet 0   rosuvastatin  (CRESTOR ) 20 MG tablet Take 1 tablet (20 mg total) by mouth daily. 90 tablet 3   No current  facility-administered medications for this visit.    REVIEW OF SYSTEMS (negative unless checked):   Cardiac:  []  Chest pain or chest pressure? []  Shortness of breath upon activity? []  Shortness of breath when lying flat? []  Irregular heart rhythm?  Vascular:  []  Pain in calf, thigh, or hip brought on by walking? []  Pain in feet at night that wakes you up from your sleep? []  Blood clot in your veins? []  Leg swelling?  Pulmonary:  []  Oxygen at home? []  Productive cough? []  Wheezing?  Neurologic:  []  Sudden weakness in arms or legs? []  Sudden numbness in arms or legs? []  Sudden onset of difficult speaking or slurred speech? []  Temporary loss of vision in one eye? []  Problems with dizziness?  Gastrointestinal:  []  Blood in stool? []  Vomited blood?  Genitourinary:  []  Burning when urinating? []  Blood in urine?  Psychiatric:  []  Major depression  Hematologic:  []  Bleeding problems? []  Problems with blood clotting?  Dermatologic:  []  Rashes or ulcers?  Constitutional:  []  Fever or chills?  Ear/Nose/Throat:  []  Change in hearing? []  Nose bleeds? []  Sore throat?  Musculoskeletal:  []  Back pain? []  Joint pain? []  Muscle pain?   Physical Examination   Vitals:   08/17/24 0820  BP: (!) 144/81  Pulse: 84  Temp: 97.9 F (36.6  C)  TempSrc: Temporal  Weight: 190 lb 3.2 oz (86.3 kg)   There is no height or weight on file to calculate BMI.  General:  WDWN in NAD; vital signs documented above Gait: Not observed HENT: WNL, normocephalic Pulmonary: normal non-labored breathing Cardiac: Regular Abdomen: soft, NT, no masses Skin: without rashes Vascular Exam/Pulses: Palpable DP/PT pulses bilaterally Extremities: without ischemic changes, without gangrene , without cellulitis; without open wounds;  Musculoskeletal: no muscle wasting or atrophy  Neurologic: A&O X 3;  No focal weakness or paresthesias are detected Psychiatric:  The pt has Normal  affect.   Non-Invasive Vascular Imaging   EVAR duplex (08/17/2024) Current size: 5.04 cm Previous size: 5.02 cm (07/18/2023) R CIA: 1.47 cm L CIA: 1.45 cm   Medical Decision Making   Anthony Summers is a 68 y.o. (May 03, 1956) male who presents for surveillance of AAA  Based on this patient's duplex, his AAA remains unchanged in size at 5.04 cm.  His endovascular stent graft repair is patent without endoleak He denies any new or changing abdominal or back pain.  On exam he has palpable pedal pulses bilaterally.  He also has palpable femoral pulses.  He has no abdominal tenderness to exam He can follow-up with our office in 1 year with repeat EVAR duplex.  He will likely need repeat carotid duplex in 2027   [facility] PA-C [facility] [place] [facility] Office: [phone]  Clinic MD: Serene

## 2024-09-21 ENCOUNTER — Other Ambulatory Visit: Payer: Self-pay | Admitting: Family Medicine

## 2024-09-21 DIAGNOSIS — F17200 Nicotine dependence, unspecified, uncomplicated: Secondary | ICD-10-CM

## 2024-09-22 ENCOUNTER — Ambulatory Visit: Payer: Self-pay | Admitting: Internal Medicine

## 2024-09-22 LAB — LIPID PANEL
Chol/HDL Ratio: 1.7 ratio (ref 0.0–5.0)
Cholesterol, Total: 174 mg/dL (ref 100–199)
HDL: 104 mg/dL (ref >39)
LDL Chol Calc (NIH): 51 mg/dL (ref 0–99)
Triglycerides: 111 mg/dL (ref 0–149)
VLDL Cholesterol Cal: 19 mg/dL (ref 5–40)

## 2024-09-22 LAB — ALT: ALT: 42 IU/L (ref 0–44)

## 2024-09-25 ENCOUNTER — Ambulatory Visit
Admission: RE | Admit: 2024-09-25 | Discharge: 2024-09-25 | Disposition: A | Discharge status: Home / Self Care | Source: Ambulatory Visit | Attending: Family Medicine | Admitting: Family Medicine

## 2024-09-25 DIAGNOSIS — F17200 Nicotine dependence, unspecified, uncomplicated: Secondary | ICD-10-CM

## 2024-11-17 ENCOUNTER — Telehealth: Payer: Self-pay | Admitting: Internal Medicine

## 2024-11-17 NOTE — Telephone Encounter (Signed)
 Spoke with pt, he reports this only happens 1 out 10 days. It is not all the time. Patient aware to stop the zetia , after 2 weeks if no change, stop the rosuvastatin . He will let us  know how he is doing.

## 2024-11-17 NOTE — Telephone Encounter (Signed)
 Pt c/o medication issue:  1. Name of Medication: ezetimibe  (ZETIA ) 10 MG tablet  rosuvastatin  (CRESTOR ) 20 MG tablet   2. How are you currently taking this medication (dosage and times per day)? As written   3. Are you having a reaction (difficulty breathing--STAT)? No   4. What is your medication issue? Pt states theses two medications are causing his hands to become stiff and lock up some days.

## 2025-01-05 ENCOUNTER — Encounter (HOSPITAL_COMMUNITY): Payer: Self-pay

## 2025-01-05 ENCOUNTER — Emergency Department (HOSPITAL_COMMUNITY)
Admission: EM | Admit: 2025-01-05 | Discharge: 2025-01-05 | Disposition: A | Discharge status: Home / Self Care | Attending: Emergency Medicine | Admitting: Emergency Medicine

## 2025-01-05 ENCOUNTER — Emergency Department (HOSPITAL_COMMUNITY)

## 2025-01-05 ENCOUNTER — Other Ambulatory Visit: Payer: Self-pay

## 2025-01-05 DIAGNOSIS — W010XXA Fall on same level from slipping, tripping and stumbling without subsequent striking against object, initial encounter: Secondary | ICD-10-CM | POA: Diagnosis not present

## 2025-01-05 DIAGNOSIS — Z7982 Long term (current) use of aspirin: Secondary | ICD-10-CM | POA: Diagnosis not present

## 2025-01-05 DIAGNOSIS — I2510 Atherosclerotic heart disease of native coronary artery without angina pectoris: Secondary | ICD-10-CM | POA: Insufficient documentation

## 2025-01-05 DIAGNOSIS — I10 Essential (primary) hypertension: Secondary | ICD-10-CM | POA: Insufficient documentation

## 2025-01-05 DIAGNOSIS — Z8546 Personal history of malignant neoplasm of prostate: Secondary | ICD-10-CM | POA: Insufficient documentation

## 2025-01-05 DIAGNOSIS — S51012A Laceration without foreign body of left elbow, initial encounter: Secondary | ICD-10-CM | POA: Insufficient documentation

## 2025-01-05 DIAGNOSIS — Z7951 Long term (current) use of inhaled steroids: Secondary | ICD-10-CM | POA: Diagnosis not present

## 2025-01-05 DIAGNOSIS — Y92009 Unspecified place in unspecified non-institutional (private) residence as the place of occurrence of the external cause: Secondary | ICD-10-CM | POA: Insufficient documentation

## 2025-01-05 DIAGNOSIS — W19XXXA Unspecified fall, initial encounter: Secondary | ICD-10-CM

## 2025-01-05 DIAGNOSIS — J449 Chronic obstructive pulmonary disease, unspecified: Secondary | ICD-10-CM | POA: Diagnosis not present

## 2025-01-05 DIAGNOSIS — S2232XA Fracture of one rib, left side, initial encounter for closed fracture: Secondary | ICD-10-CM | POA: Insufficient documentation

## 2025-01-05 DIAGNOSIS — S299XXA Unspecified injury of thorax, initial encounter: Secondary | ICD-10-CM | POA: Diagnosis present

## 2025-01-05 LAB — BASIC METABOLIC PANEL WITH GFR
Anion gap: 20 — ABNORMAL HIGH (ref 5–15)
BUN: 16 mg/dL (ref 8–23)
CO2: 21 mmol/L — ABNORMAL LOW (ref 22–32)
Calcium: 8.8 mg/dL — ABNORMAL LOW (ref 8.9–10.3)
Chloride: 100 mmol/L (ref 98–111)
Creatinine, Ser: 0.56 mg/dL — ABNORMAL LOW (ref 0.61–1.24)
GFR, Estimated: >60 mL/min
Glucose, Bld: 133 mg/dL — ABNORMAL HIGH (ref 70–99)
Potassium: 3.8 mmol/L (ref 3.5–5.1)
Sodium: 140 mmol/L (ref 135–145)

## 2025-01-05 LAB — CBC WITH DIFFERENTIAL/PLATELET
Abs Immature Granulocytes: 0.06 K/uL (ref 0.00–0.07)
Basophils Absolute: 0 K/uL (ref 0.0–0.1)
Basophils Relative: 0 %
Eosinophils Absolute: 0 K/uL (ref 0.0–0.5)
Eosinophils Relative: 0 %
HCT: 40.4 % (ref 39.0–52.0)
Hemoglobin: 14 g/dL (ref 13.0–17.0)
Immature Granulocytes: 1 %
Lymphocytes Relative: 12 %
Lymphs Abs: 0.8 K/uL (ref 0.7–4.0)
MCH: 34.5 pg — ABNORMAL HIGH (ref 26.0–34.0)
MCHC: 34.7 g/dL (ref 30.0–36.0)
MCV: 99.5 fL (ref 80.0–100.0)
Monocytes Absolute: 0.5 K/uL (ref 0.1–1.0)
Monocytes Relative: 7 %
Neutro Abs: 5.7 K/uL (ref 1.7–7.7)
Neutrophils Relative %: 80 %
Platelets: 170 K/uL (ref 150–400)
RBC: 4.06 MIL/uL — ABNORMAL LOW (ref 4.22–5.81)
RDW: 13.6 % (ref 11.5–15.5)
WBC: 7.1 K/uL (ref 4.0–10.5)
nRBC: 0 % (ref 0.0–0.2)

## 2025-01-05 LAB — LIPASE, BLOOD: Lipase: 18 U/L (ref 11–51)

## 2025-01-05 LAB — MAGNESIUM: Magnesium: 1.8 mg/dL (ref 1.7–2.4)

## 2025-01-05 LAB — TROPONIN T, HIGH SENSITIVITY: Troponin T High Sensitivity: <15 ng/L (ref 0–19)

## 2025-01-05 MED ORDER — LIDOCAINE 5 % EX PTCH: 1.0000 | MEDICATED_PATCH | CUTANEOUS | 0 refills | Status: AC

## 2025-01-05 MED ORDER — IPRATROPIUM-ALBUTEROL 0.5-2.5 (3) MG/3ML IN SOLN
3.0000 mL | Freq: Once | RESPIRATORY_TRACT | Status: AC
  Administered 2025-01-05: 3 mL via RESPIRATORY_TRACT
  Filled 2025-01-05: qty 3

## 2025-01-05 MED ORDER — LIDOCAINE 5 % EX PTCH
1.0000 | MEDICATED_PATCH | CUTANEOUS | Status: DC
  Administered 2025-01-05: 1 via TRANSDERMAL
  Filled 2025-01-05: qty 1

## 2025-01-05 MED ORDER — OXYCODONE-ACETAMINOPHEN 5-325 MG PO TABS: 1.0000 | ORAL_TABLET | Freq: Four times a day (QID) | ORAL | 0 refills | Status: AC | PRN

## 2025-01-05 MED ORDER — PREDNISONE 10 MG (21) PO TBPK: ORAL_TABLET | Freq: Every day | ORAL | 0 refills | Status: AC

## 2025-01-05 MED ORDER — HYDROMORPHONE HCL 1 MG/ML IJ SOLN
1.0000 mg | Freq: Once | INTRAMUSCULAR | Status: AC
  Administered 2025-01-05: 1 mg via INTRAVENOUS
  Filled 2025-01-05: qty 1

## 2025-01-05 MED ORDER — PREDNISONE 50 MG PO TABS
60.0000 mg | ORAL_TABLET | Freq: Once | ORAL | Status: AC
  Administered 2025-01-05: 60 mg via ORAL
  Filled 2025-01-05: qty 1

## 2025-01-05 MED ORDER — METHOCARBAMOL 500 MG PO TABS
500.0000 mg | ORAL_TABLET | Freq: Once | ORAL | Status: AC
  Administered 2025-01-05: 500 mg via ORAL
  Filled 2025-01-05: qty 1

## 2025-01-05 MED ORDER — ALBUTEROL SULFATE HFA 108 (90 BASE) MCG/ACT IN AERS
2.0000 | INHALATION_SPRAY | Freq: Once | RESPIRATORY_TRACT | Status: AC
  Administered 2025-01-05: 2 via RESPIRATORY_TRACT
  Filled 2025-01-05: qty 6.7

## 2025-01-05 MED ORDER — MELOXICAM 15 MG PO TABS: 15.0000 mg | ORAL_TABLET | Freq: Every day | ORAL | 0 refills | Status: AC

## 2025-01-05 MED ORDER — METHOCARBAMOL 500 MG PO TABS: 500.0000 mg | ORAL_TABLET | Freq: Three times a day (TID) | ORAL | 0 refills | Status: AC | PRN

## 2025-01-05 MED ORDER — KETOROLAC TROMETHAMINE 15 MG/ML IJ SOLN
15.0000 mg | Freq: Once | INTRAMUSCULAR | Status: AC
  Administered 2025-01-05: 15 mg via INTRAVENOUS
  Filled 2025-01-05: qty 1

## 2025-01-05 NOTE — ED Triage Notes (Signed)
 Pt arrived via POV from home c/o injuries to left rib cage and left elbow from a fall at home yesterday. Pt reports he tripped and fell over the dog. Bleeding controlled at this time.

## 2025-01-05 NOTE — Discharge Instructions (Addendum)
 Take 650 mg Tylenol  every 6 hours. Additional prescriptions for pain medication were sent to your pharmacy: -Lidocaine  patches can be switched out daily -Meloxicam  is an NSAID medication.  Take this in place of over-the-counter NSAIDs (ibuprofen , Aleve, Goody's powders, etc.) -Methocarbamol  is a muscle relaxer.  Take this as needed. -Oxycodone  is a narcotic pain medication.  Take this only as needed. - Prednisone  is a steroid to help lower inflammation and open up airways.  Take in tapering dose as prescribed.  It is important to control your pain so that you are able to move and take deep breaths.  This can help prevent developing a pneumonia following your injury.  Use albuterol  inhaler as needed for shortness of breath, wheezing, or chest tightness.  If you do have any new or worsening symptoms of concern, return to the emergency department.

## 2025-01-05 NOTE — ED Notes (Signed)
 Pt sustained skin tear to L elbow prior to this ED visit, skin tear cleansed and bandaged, bleeding controlled at this time

## 2025-01-05 NOTE — ED Provider Notes (Signed)
 "  EMERGENCY DEPARTMENT AT [facility] Provider Note   CSN: [id number] Arrival date & time: 01/05/25  1044     Patient presents with: Anthony Summers is a 69 y.o. male.   HPI Patient presents for chest pain.  Medical history includes HTN, HLD, depression, AAA s/p EVAR, CAD, COPD, prostate cancer, anxiety.  Last night, patient had a mechanical fall.  This occurred at home, while he was on his porch.  He landed on the porch, striking his left side.  He sustained a wound to his left elbow and has since had a left-sided chest pain.  He took 5 mg of oxycodone  1 hour prior to arrival.  He does have ongoing pain at this time.    Prior to Admission medications  Medication Sig Start Date End Date Taking? Authorizing Provider  lidocaine  (LIDODERM ) 5 % Place 1 patch onto the skin daily. Remove & Discard patch within 12 hours or as directed by MD 01/05/25  Yes Melvenia Motto, MD  meloxicam  (MOBIC ) 15 MG tablet Take 1 tablet (15 mg total) by mouth daily for 7 days. 01/05/25 01/12/25 Yes Melvenia Motto, MD  methocarbamol  (ROBAXIN ) 500 MG tablet Take 1 tablet (500 mg total) by mouth every 8 (eight) hours as needed for muscle spasms. 01/05/25  Yes Melvenia Motto, MD  oxyCODONE -acetaminophen  (PERCOCET/ROXICET) 5-325 MG tablet Take 1 tablet by mouth every 6 (six) hours as needed for up to 5 days for severe pain (pain score 7-10). 01/05/25 01/10/25 Yes Melvenia Motto, MD  predniSONE  (STERAPRED UNI-PAK 21 TAB) 10 MG (21) TBPK tablet Take by mouth daily. Take 6 tabs by mouth daily  for 2 days, then 5 tabs for 2 days, then 4 tabs for 2 days, then 3 tabs for 2 days, 2 tabs for 2 days, then 1 tab by mouth daily for 2 days 01/05/25  Yes Melvenia Motto, MD  albuterol  (VENTOLIN  HFA) 108 (90 Base) MCG/ACT inhaler Inhale 1-2 puffs into the lungs every 6 (six) hours as needed for shortness of breath or wheezing. 05/13/20   [provider]  ALPRAZolam (XANAX) 0.5 MG tablet Take 0.5 mg by mouth as needed for  anxiety or sleep.    [provider]  aspirin  EC 81 MG tablet Take 1 tablet (81 mg total) by mouth daily. Swallow whole. 10/18/23   Dawley, Troy C, DO  Cholecalciferol (VITAMIN D3 ULTRA STRENGTH) 125 MCG (5000 UT) capsule Take 5,000 Units by mouth daily.    [provider]  ezetimibe  (ZETIA ) 10 MG tablet Take 10 mg by mouth in the morning. 08/18/19   [provider]  fluticasone  (FLONASE ) 50 MCG/ACT nasal spray Place 1-2 sprays into both nostrils daily as needed for allergies. 08/09/19   [provider]  furosemide  (LASIX ) 20 MG tablet Take 20 mg by mouth in the morning. Patient not taking: Reported on 08/17/2024 08/18/19   [provider]  furosemide  (LASIX ) 20 MG tablet Take 1 tablet (20 mg total) by mouth daily as needed. Patient not taking: Reported on 08/17/2024 01/31/24   Santo Stanly LABOR, MD  Multiple Vitamins-Minerals (CENTRUM SILVER 50+MEN) TABS Take 1 tablet by mouth in the morning.    [provider]  Multiple Vitamins-Minerals (PRESERVISION AREDS 2 PO) Take 2 capsules by mouth in the morning.    [provider]  oxyCODONE -acetaminophen  (PERCOCET/ROXICET) 5-325 MG tablet Take 1 tablet by mouth every 6 (six) hours as needed for severe pain (pain score 7-10). 11/13/23   Horton,  Roxie HERO, DO  rosuvastatin  (CRESTOR ) 20 MG tablet Take 1 tablet (20 mg total) by mouth daily. 01/31/24   Santo Stanly LABOR, MD    Allergies: Patient has no known allergies.    Review of Systems  Cardiovascular:  Positive for chest pain.  Skin:  Positive for wound.  All other systems reviewed and are negative.   Updated Vital Signs BP (!) 142/84 (BP Location: Right Arm)   Pulse (!) 101   Temp 98.1 F (36.7 C) (Oral)   Resp 18   Ht 6' 1 (1.854 m)   Wt 84.4 kg   SpO2 95%   BMI 24.54 kg/m   Physical Exam Vitals and nursing note reviewed.  Constitutional:      General: He is not in acute distress.    Appearance: Normal appearance.  He is well-developed. He is not ill-appearing, toxic-appearing or diaphoretic.  HENT:     Head: Normocephalic and atraumatic.     Right Ear: External ear normal.     Left Ear: External ear normal.     Nose: Nose normal.     Mouth/Throat:     Mouth: Mucous membranes are moist.  Eyes:     Extraocular Movements: Extraocular movements intact.     Conjunctiva/sclera: Conjunctivae normal.  Cardiovascular:     Rate and Rhythm: Normal rate and regular rhythm.  Pulmonary:     Effort: Pulmonary effort is normal. No respiratory distress.  Chest:     Chest wall: Tenderness present.  Abdominal:     General: There is no distension.     Palpations: Abdomen is soft.     Tenderness: There is no abdominal tenderness.  Musculoskeletal:        General: No swelling. Normal range of motion.     Cervical back: Normal range of motion and neck supple.  Skin:    General: Skin is warm and dry.     Comments: Skin tear of left elbow  Neurological:     General: No focal deficit present.     Mental Status: He is alert and oriented to person, place, and time.  Psychiatric:        Mood and Affect: Mood normal.        Behavior: Behavior normal.     (all labs ordered are listed, but only abnormal results are displayed) Labs Reviewed  BASIC METABOLIC PANEL WITH GFR - Abnormal; Notable for the following components:      Result Value   CO2 21 (*)    Glucose, Bld 133 (*)    Creatinine, Ser 0.56 (*)    Calcium  8.8 (*)    Anion gap 20 (*)    All other components within normal limits  CBC WITH DIFFERENTIAL/PLATELET - Abnormal; Notable for the following components:   RBC 4.06 (*)    MCH 34.5 (*)    All other components within normal limits  MAGNESIUM   LIPASE, BLOOD  CBG MONITORING, ED  TROPONIN T, HIGH SENSITIVITY  TROPONIN T, HIGH SENSITIVITY    EKG: EKG Interpretation Date/Time:  Tuesday January 05 2025 12:12:07 EST Ventricular Rate:  98 PR Interval:    QRS Duration:  101 QT Interval:  378 QTC  Calculation: 483 R Axis:   51  Text Interpretation: Sinus rhythm Low voltage, precordial leads Borderline prolonged QT interval Artifact in lead(s) I II aVR V4 V5 Confirmed by Melvenia Motto [phone]) on 01/05/2025 12:43:21 PM  Radiology: DG Chest Portable 1 View Result Date: 01/05/2025 CLINICAL DATA:  Fall with left  rib pain. EXAM: PORTABLE CHEST 1 VIEW COMPARISON:  01/24/2022 FINDINGS: Lungs are adequately inflated without focal airspace consolidation, effusion or pneumothorax. Cardiomediastinal silhouette is normal. There is 8 minimally displaced acute left posterior sixth rib fracture. IMPRESSION: 1. No acute cardiopulmonary disease. 2. Minimally displaced acute left posterior sixth rib fracture. Electronically Signed   By: Toribio Agreste M.D.   On: 01/05/2025 11:46     Procedures   Medications Ordered in the ED  lidocaine  (LIDODERM ) 5 % 1 patch (1 patch Transdermal Patch Applied 01/05/25 1241)  ipratropium-albuterol  (DUONEB) 0.5-2.5 (3) MG/3ML nebulizer solution 3 mL (has no administration in time range)  predniSONE  (DELTASONE ) tablet 60 mg (has no administration in time range)  HYDROmorphone  (DILAUDID ) injection 1 mg (1 mg Intravenous Given 01/05/25 1150)  ketorolac  (TORADOL ) 15 MG/ML injection 15 mg (15 mg Intravenous Given 01/05/25 1240)  methocarbamol  (ROBAXIN ) tablet 500 mg (500 mg Oral Given 01/05/25 1241)                                    Medical Decision Making Amount and/or Complexity of Data Reviewed Labs: ordered. Radiology: ordered.  Risk Prescription drug management.   This patient presents to the ED for concern of chest pain, this involves an extensive number of treatment options, and is a complaint that carries with it a high risk of complications and morbidity.  The differential diagnosis includes rib fracture, bruising, ACS, pneumonia, pneumothorax   Co morbidities / Chronic conditions that complicate the patient evaluation  HTN, HLD, depression, AAA s/p EVAR, CAD,  COPD, prostate cancer, anxiety   Additional history obtained:  Additional history obtained from EMR External records from outside source obtained and reviewed including N/A   Lab Tests:  I Ordered, and personally interpreted labs.  The pertinent results include: Normal hemoglobin, no leukocytosis, normal kidney function, normal electrolytes, normal troponin   Imaging Studies ordered:  I ordered imaging studies including chest x-ray I independently visualized and interpreted imaging which showed minimally displaced left posterior sixth rib fracture I agree with the radiologist interpretation   Cardiac Monitoring: / EKG:  The patient was maintained on a cardiac monitor.  I personally viewed and interpreted the cardiac monitored which showed an underlying rhythm of: Sinus rhythm   Problem List / ED Course / Critical interventions / Medication management  Patient presenting for left-sided chest pain.  He attributes this to a fall that occurred last night.  He describes the fall as mechanical while at home, on his deck.  On arrival in the ED, vital signs notable for mild hypertension.  On exam, patient does appear uncomfortable.  He did take an oxycodone  at home this morning with minimal relief.  He has absence of pleurisy and only minimal tenderness which does raise concern for nontraumatic etiology of his pain.  Multimodal pain control was ordered.  Workup was initiated.  Patient's lab work shows normal troponin.  Chest x-ray does show acute left posterior sixth rib fracture.  On reassessment, patient's pain is improved.  On my recitation, he does have some wheezing present.  Prednisone  and DuoNeb ordered to optimize lung function in the setting of his injury.  Patient states that he does use an albuterol  inhaler as needed at home.  Patient was prescribed multimodal pain control.  He was discharged in stable condition. I ordered medication including Dilaudid , Toradol , lidocaine  patch,  Robaxin  for analgesia; prednisone  and DuoNeb for COPD Reevaluation of  the patient after these medicines showed that the patient improved I have reviewed the patients home medicines and have made adjustments as needed  Social Determinants of Health:  Lives at home with wife      Final diagnoses:  Fall, initial encounter  Closed fracture of one rib of left side, initial encounter    ED Discharge Orders          Ordered    meloxicam  (MOBIC ) 15 MG tablet  Daily        01/05/25 1311    methocarbamol  (ROBAXIN ) 500 MG tablet  Every 8 hours PRN        01/05/25 1311    lidocaine  (LIDODERM ) 5 %  Every 24 hours        01/05/25 1311    oxyCODONE -acetaminophen  (PERCOCET/ROXICET) 5-325 MG tablet  Every 6 hours PRN        01/05/25 1311    predniSONE  (STERAPRED UNI-PAK 21 TAB) 10 MG (21) TBPK tablet  Daily        01/05/25 1311               Melvenia Motto, MD 01/05/25 1312  "

## 2025-01-10 ENCOUNTER — Other Ambulatory Visit: Payer: Self-pay | Admitting: Internal Medicine

## 2025-01-13 NOTE — Telephone Encounter (Signed)
 Lipid done on 09/21/24

## 2025-03-09 ENCOUNTER — Ambulatory Visit: Admitting: Internal Medicine
